# Patient Record
Sex: Female | Born: 1995
Health system: Southern US, Community
[De-identification: ages and names within clinical notes are randomized; demographics above are authoritative.]

---

## 2014-11-04 ENCOUNTER — Ambulatory Visit (INDEPENDENT_AMBULATORY_CARE_PROVIDER_SITE_OTHER): Payer: BLUE CROSS/BLUE SHIELD | Admitting: Physician Assistant

## 2014-11-04 VITALS — BP 96/58 | HR 62 | Temp 99.0°F | Resp 16 | Ht 67.0 in | Wt 123.0 lb

## 2014-11-04 DIAGNOSIS — Z7251 High risk heterosexual behavior: Secondary | ICD-10-CM | POA: Diagnosis not present

## 2014-11-04 DIAGNOSIS — R300 Dysuria: Secondary | ICD-10-CM

## 2014-11-04 LAB — POCT WET PREP WITH KOH
KOH PREP POC: NEGATIVE
TRICHOMONAS UA: NEGATIVE
YEAST WET PREP PER HPF POC: NEGATIVE

## 2014-11-04 LAB — POCT URINALYSIS DIPSTICK
Bilirubin, UA: NEGATIVE
Blood, UA: NEGATIVE
Glucose, UA: NEGATIVE
KETONES UA: NEGATIVE
Leukocytes, UA: NEGATIVE
Nitrite, UA: NEGATIVE
Protein, UA: NEGATIVE
Spec Grav, UA: >=1.03
Urobilinogen, UA: 0.2
pH, UA: 6.5

## 2014-11-04 LAB — POCT UA - MICROSCOPIC ONLY
CRYSTALS, UR, HPF, POC: NEGATIVE
Casts, Ur, LPF, POC: NEGATIVE
Yeast, UA: NEGATIVE

## 2014-11-04 MED ORDER — SULFAMETHOXAZOLE-TRIMETHOPRIM 800-160 MG PO TABS: 1.0000 | ORAL_TABLET | Freq: Two times a day (BID) | ORAL | Status: DC

## 2014-11-04 NOTE — Progress Notes (Deleted)
Subjective:     Patient ID: Paula HerterBrea Fischer, female   DOB: 29-Apr-1995, 19 y.o.   MRN: [id number]  HPI   Review of Systems     Objective:   Physical Exam     Assessment:     ***    Plan:     ***

## 2014-11-04 NOTE — Progress Notes (Signed)
Subjective:    Patient ID: Paula Fischer, female    DOB: [birth year], 19 y.o.   MRN: [id number]  HPI Patient presents for dysuria and urinary frequency that have been present for past week. Additionally endorses lower abdominal pain initially which has improved. Denies urgency, decreased vol., flank/back pain, N/V, vaginal discharge/bleeding/pain. Sexually active with monogamous female partner for 1 year and they use condoms sometimes. Has not been tested for STDs for a long time, but was negative. LMP 10/20/14 was normal. NKDA.   Review of Systems  Constitutional: Negative.  Negative for fever.  Gastrointestinal: Positive for abdominal pain (resolved). Negative for nausea.  Genitourinary: Positive for dysuria. Negative for urgency, hematuria, flank pain, decreased urine volume, vaginal bleeding, vaginal discharge, difficulty urinating, vaginal pain, menstrual problem, pelvic pain and dyspareunia.  Musculoskeletal: Negative for back pain.  Neurological: Negative for headaches.       Objective:   Physical Exam  Constitutional: She is oriented to person, place, and time. She appears well-developed and well-nourished. No distress.  Blood pressure 96/58, pulse 62, temperature 99 F (37.2 C), resp. rate 16, height 5\' 7"  (1.702 m), weight 123 lb (55.792 kg), last menstrual period 10/18/2014, SpO2 98 %.  HENT:  Head: Normocephalic and atraumatic.  Right Ear: External ear normal.  Left Ear: External ear normal.  Eyes: Conjunctivae are normal. Right eye exhibits no discharge. Left eye exhibits no discharge. No scleral icterus.  Cardiovascular: Normal rate, regular rhythm and normal heart sounds.  Exam reveals no gallop and no friction rub.   No murmur heard. Pulmonary/Chest: Effort normal and breath sounds normal. No respiratory distress. She has no wheezes. She has no rales.  Abdominal: Soft. Bowel sounds are normal. She exhibits no distension and no mass. There is no tenderness. There is no  rebound, no guarding and no CVA tenderness. No hernia.  Neurological: She is alert and oriented to person, place, and time.  Skin: Skin is warm and dry. No rash noted. She is not diaphoretic. No erythema. No pallor.  Psychiatric: She has a normal mood and affect. Her behavior is normal. Judgment and thought content normal.   Results for orders placed or performed in visit on 11/04/14  POCT Wet Prep with KOH  Result Value Ref Range   Trichomonas, UA Negative    Clue Cells Wet Prep HPF POC 2-4    Epithelial Wet Prep HPF POC Moderate Few, Moderate, Many   Yeast Wet Prep HPF POC neg    Bacteria Wet Prep HPF POC Moderate (A) Few   RBC Wet Prep HPF POC 0-2    WBC Wet Prep HPF POC 8-12    KOH Prep POC Negative   POCT urinalysis dipstick  Result Value Ref Range   Color, UA yellow    Clarity, UA clear    Glucose, UA neg    Bilirubin, UA neg    Ketones, UA neg    Spec Grav, UA >=1.030    Blood, UA neg    pH, UA 6.5    Protein, UA neg    Urobilinogen, UA 0.2    Nitrite, UA neg    Leukocytes, UA Negative Negative  POCT UA - Microscopic Only  Result Value Ref Range   WBC, Ur, HPF, POC 2-4    RBC, urine, microscopic 1-3    Bacteria, U Microscopic trace    Mucus, UA trace    Epithelial cells, urine per micros 1-3    Crystals, Ur, HPF, POC neg    Casts,  Ur, LPF, POC neg    Yeast, UA neg        Assessment & Plan:  1. Dysuria Labs neg. Will treat presumptively due to symptoms. - POCT urinalysis dipstick - POCT UA - Microscopic Only - sulfamethoxazole-trimethoprim (BACTRIM DS,SEPTRA DS) 800-160 MG per tablet; Take 1 tablet by mouth 2 (two) times daily.  Dispense: 6 tablet; Refill: 0  2. Unprotected sexual intercourse - POCT Wet Prep with KOH - GC/Chlamydia Probe Amp - RPR - HIV antibody   Janan Ridge PA-C  [facility] 11/04/2014 6:27 PM

## 2014-11-05 LAB — HIV ANTIBODY (ROUTINE TESTING W REFLEX): HIV 1&2 Ab, 4th Generation: NONREACTIVE

## 2014-11-05 LAB — RPR

## 2014-11-06 LAB — GC/CHLAMYDIA PROBE AMP
CT PROBE, AMP APTIMA: NEGATIVE
GC Probe RNA: NEGATIVE

## 2014-11-09 ENCOUNTER — Telehealth: Payer: Self-pay | Admitting: Physician Assistant

## 2014-11-09 NOTE — Telephone Encounter (Signed)
Left vmail. STD screening labs all neg.

## 2014-11-10 NOTE — Addendum Note (Signed)
Addended by: Carmelina DaneANDERSON, Suki Crockett S on: 11/10/2014 05:35 PM   Modules accepted: Kipp BroodSmartSet

## 2014-11-10 NOTE — Progress Notes (Signed)
  Medical screening examination/treatment/procedure(s) were performed by non-physician practitioner and as supervising physician I was immediately available for consultation/collaboration.     

## 2015-02-08 ENCOUNTER — Ambulatory Visit (INDEPENDENT_AMBULATORY_CARE_PROVIDER_SITE_OTHER): Payer: BLUE CROSS/BLUE SHIELD | Admitting: Family Medicine

## 2015-02-08 VITALS — BP 116/78 | HR 100 | Temp 98.5°F | Resp 16 | Ht 67.0 in | Wt 121.0 lb

## 2015-02-08 DIAGNOSIS — Z113 Encounter for screening for infections with a predominantly sexual mode of transmission: Secondary | ICD-10-CM | POA: Diagnosis not present

## 2015-02-08 LAB — POCT WET + KOH PREP
Trich by wet prep: ABSENT
YEAST BY WET PREP: ABSENT
Yeast by KOH: ABSENT

## 2015-02-08 NOTE — Patient Instructions (Signed)
I will be in touch with your labs asap If you do test positive for herpes, we can start you on suppressive therapy (medication to help prevent outbreaks) if you like If you are negative I would suggest that we repeat your blood test in 4-6 months to make sure it stays negative

## 2015-02-08 NOTE — Progress Notes (Signed)
[facility] [address], [place] [address] [phone]  Date:  02/08/2015   Name:  Paula HerterBrea Delarosa   DOB:  Feb 23, 1996   MRN:  [id number]  PCP:  No PCP Per Patient    Chief Complaint: Exposure to STD   History of Present Illness:  Paula HerterBrea Ekman is a 19 y.o. very pleasant female patient who presents with the following:  Generally healthy young lady here today with concern of STI She learned that her BF had been unfaithful to her yesterday- also his previous GF told her that she caught herpes from this person.   A couple of weeks ago she thought she had a "razor bump" lesion in her groin- this was pretty painful.  It is now healed up but she is worried that this was actually HSV  No unusual discharge.   LMP was 9/25   There are no active problems to display for this patient.   No past medical history on file.  No past surgical history on file.  Social History  Substance Use Topics  . Smoking status: Never Smoker   . Smokeless tobacco: Never Used  . Alcohol Use: No    No family history on file.  No Known Allergies  Medication list has been reviewed and updated.  No current outpatient prescriptions on file prior to visit.   No current facility-administered medications on file prior to visit.    Review of Systems:  As per HPI- otherwise negative.   Physical Examination: Filed Vitals:   02/08/15 1233  BP: 116/78  Pulse: 100  Temp: 98.5 F (36.9 C)  Resp: 16   Filed Vitals:   02/08/15 1233  Height: 5\' 7"  (1.702 m)  Weight: 121 lb (54.885 kg)   Body mass index is 18.95 kg/(m^2). Ideal Body Weight: Weight in (lb) to have BMI = 25: 159.3  GEN: WDWN, NAD, Non-toxic, A & O x 3, looks well HEENT: Atraumatic, Normocephalic. Neck supple. No masses, No LAD. Ears and Nose: No external deformity. CV: RRR, No M/G/R. No JVD. No thrill. No extra heart sounds. PULM: CTA B, no wheezes, crackles, rhonchi. No retractions. No resp. distress. No  accessory muscle use. ABD: S, NT, ND EXTR: No c/c/e NEURO Normal gait.  PSYCH: Normally interactive. Conversant. Not depressed or anxious appearing.  Calm demeanor.  Pelvic: normal, no vaginal lesions or discharge.  Results for orders placed or performed in visit on 02/08/15  POCT Wet + KOH Prep ([facility])  Result Value Ref Range   Yeast by KOH Absent Present, Absent   Yeast by wet prep Absent Present, Absent   WBC by wet prep Moderate (A) None, Few   Clue Cells Wet Prep HPF POC Few (A) None   Trich by wet prep Absent Present, Absent   Bacteria Wet Prep HPF POC Few None, Few   Epithelial Cells By [employer] Pref ([facility]) Moderate (A) None, Few   RBC,UR,HPF,POC None None RBC/hpf    Assessment and Plan: Screening for STD (sexually transmitted disease) - Plan: GC/Chlamydia Probe Amp, Hepatitis B surface antibody, Hepatitis B surface antigen, Hepatitis C antibody, HIV antibody, HSV(herpes simplex vrs) 1+2 ab-IgG, RPR, POCT Wet + KOH Prep ([facility])  Concern about possible HSV exposure.  She does not currently have any sign of outbreak.  Did labs as above Reassured that we will be in touch with her labs asap and discussed what we might do if she is positive for HSV  Signed Abbe AmsterdamJessica Ivon Oelkers, MD

## 2015-02-09 ENCOUNTER — Encounter: Payer: Self-pay | Admitting: Family Medicine

## 2015-02-09 DIAGNOSIS — R768 Other specified abnormal immunological findings in serum: Secondary | ICD-10-CM

## 2015-02-09 LAB — HSV(HERPES SIMPLEX VRS) I + II AB-IGG
HSV 1 Glycoprotein G Ab, IgG: 7.53 IV — ABNORMAL HIGH
HSV 2 GLYCOPROTEIN G AB, IGG: 9.77 IV — AB

## 2015-02-09 LAB — HEPATITIS B SURFACE ANTIGEN: Hepatitis B Surface Ag: NEGATIVE

## 2015-02-09 LAB — HEPATITIS B SURFACE ANTIBODY, QUANTITATIVE: HEPATITIS B-POST: 0.1 m[IU]/mL

## 2015-02-09 LAB — HIV ANTIBODY (ROUTINE TESTING W REFLEX): HIV: NONREACTIVE

## 2015-02-09 LAB — RPR

## 2015-02-09 LAB — GC/CHLAMYDIA PROBE AMP
CT Probe RNA: NEGATIVE
GC Probe RNA: NEGATIVE

## 2015-02-09 LAB — HEPATITIS C ANTIBODY: HCV AB: NEGATIVE

## 2015-02-10 MED ORDER — VALACYCLOVIR HCL 1 G PO TABS: 1000.0000 mg | ORAL_TABLET | Freq: Every day | ORAL | Status: DC

## 2015-02-11 ENCOUNTER — Encounter: Payer: Self-pay | Admitting: Family Medicine

## 2015-03-10 ENCOUNTER — Encounter: Payer: Self-pay | Admitting: Family Medicine

## 2015-03-10 DIAGNOSIS — R768 Other specified abnormal immunological findings in serum: Secondary | ICD-10-CM

## 2015-03-11 MED ORDER — VALACYCLOVIR HCL 1 G PO TABS: 1000.0000 mg | ORAL_TABLET | Freq: Every day | ORAL | Status: DC

## 2015-03-12 ENCOUNTER — Other Ambulatory Visit: Payer: Self-pay

## 2015-03-12 DIAGNOSIS — R768 Other specified abnormal immunological findings in serum: Secondary | ICD-10-CM

## 2015-05-07 ENCOUNTER — Telehealth: Payer: Self-pay

## 2015-05-07 NOTE — Telephone Encounter (Signed)
Spoke with patient. She needs labs sent [facility]. Verbal request form completed and labs faxed thru Epic.

## 2015-05-07 NOTE — Telephone Encounter (Signed)
Patient left voicemail today at [phone] [place] and needs labs from last visit in October sent to her doctor for annual exam. Says she will go online and complete ROI form.  Cb# [phone].

## 2015-08-26 ENCOUNTER — Ambulatory Visit (INDEPENDENT_AMBULATORY_CARE_PROVIDER_SITE_OTHER): Payer: BLUE CROSS/BLUE SHIELD | Admitting: Family Medicine

## 2015-08-26 VITALS — BP 102/64 | HR 66 | Temp 99.0°F | Resp 16 | Ht 67.0 in | Wt 131.0 lb

## 2015-08-26 DIAGNOSIS — Z0000 Encounter for general adult medical examination without abnormal findings: Secondary | ICD-10-CM

## 2015-08-26 DIAGNOSIS — Z111 Encounter for screening for respiratory tuberculosis: Secondary | ICD-10-CM

## 2015-08-26 NOTE — Progress Notes (Signed)
  Tuberculosis Risk Questionnaire  1. No Were you born outside the [place] in one of the following parts of the world: [place], [place], [place], [place] or [place]?    2. No Have you traveled outside the [place] and lived for more than one month in one of the following parts of the world: [place], [place], [place], [place] or [place]?    3. No Do you have a compromised immune system such as from any of the following conditions:HIV/AIDS, organ or bone marrow transplantation, diabetes, immunosuppressive medicines (e.g. Prednisone, Remicaide), leukemia, lymphoma, cancer of the head or neck, gastrectomy or jejunal bypass, end-stage renal disease (on dialysis), or silicosis?     4. Yes Have you ever or do you plan on working in: a residential care center, a health care facility, a jail or prison or homeless shelter?    5. No Have you ever: injected illegal drugs, used crack cocaine, lived in a homeless shelter  or been in jail or prison?     6. No Have you ever been exposed to anyone with infectious tuberculosis?    Tuberculosis Symptom Questionnaire  Do you currently have any of the following symptoms?  1. No Unexplained cough lasting more than 3 weeks?   2. No Unexplained fever lasting more than 3 weeks.   3. No Night Sweats (sweating that leaves the bedclothes and sheets wet)     4. No Shortness of Breath   5. No Chest Pain   6. No Unintentional weight loss    7. 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      ICD-9-CM ICD-10-CM   1. Routine general medical examination at a health care facility V70.0 Z00.00 Quantiferon tb gold assay     Hepatitis B surface antibody     Signed, Elvina SidleKurt Lauenstein, MD

## 2015-08-26 NOTE — Patient Instructions (Addendum)
Check MYCHART for lab result

## 2015-08-27 LAB — HEPATITIS B SURFACE ANTIBODY, QUANTITATIVE: Hepatitis B-Post: 0.1 m[IU]/mL

## 2015-08-28 LAB — QUANTIFERON TB GOLD ASSAY (BLOOD)
Interferon Gamma Release Assay: NEGATIVE
Mitogen-Nil: >10 IU/mL
Quantiferon Nil Value: 0.02 IU/mL
Quantiferon Tb Ag Minus Nil Value: 0.01 IU/mL

## 2016-02-07 ENCOUNTER — Ambulatory Visit (INDEPENDENT_AMBULATORY_CARE_PROVIDER_SITE_OTHER): Payer: BLUE CROSS/BLUE SHIELD | Admitting: Family Medicine

## 2016-02-07 VITALS — BP 118/80 | HR 73 | Temp 98.2°F | Resp 18 | Ht 67.0 in | Wt 132.4 lb

## 2016-02-07 DIAGNOSIS — R21 Rash and other nonspecific skin eruption: Secondary | ICD-10-CM

## 2016-02-07 MED ORDER — CLOBETASOL PROPIONATE 0.05 % EX OINT: 1.0000 "application " | TOPICAL_OINTMENT | Freq: Two times a day (BID) | CUTANEOUS | 0 refills | Status: DC

## 2016-02-07 NOTE — Patient Instructions (Addendum)
Apply Lotrisone ointment to affected areas.  Follow-up as needed.   IF you received an x-ray today, you will receive an invoice from [facility]. Please contact [facility] at [phone] with questions or concerns regarding your invoice.   IF you received labwork today, you will receive an invoice from [employer]. Please contact [facility] at [phone] with questions or concerns regarding your invoice.   Our billing staff will not be able to assist you with questions regarding bills from these companies.  You will be contacted with the lab results as soon as they are available. The fastest way to get your results is to activate your My Chart account. Instructions are located on the last page of this paperwork. If you have not heard from [place] regarding the results in 2 weeks, please contact this office.     Bee, Wasp, or [employer], wasps, and hornets are part of a family of insects that can sting people. These stings can cause pain and inflammation, but they are usually not serious. However, some people may have an allergic reaction to a sting. This can cause the symptoms to be more severe.  SYMPTOMS  Common symptoms of this condition include:   A red lump in the skin that sometimes has a tiny hole in the center. In some cases, a stinger may be in the center of the wound.  Pain and itching at the sting site.  Redness and swelling around the sting site. If you have an allergic reaction (localized allergic reaction), the swelling and redness may spread out from the sting site. In some cases, this reaction can continue to develop over the next 12-36 hours. In rare cases, a person may have a severe allergic reaction (anaphylactic reaction) to a sting. Symptoms of an anaphylactic reaction may include:   Wheezing or difficulty breathing.  Raised, itchy, red patches on the skin.  Nausea or vomiting.  Abdominal  cramping.  Diarrhea.  Chest pain.  Fainting.  Redness of the face (flushing). DIAGNOSIS  This condition is usually diagnosed based on symptoms, medical history, and a physical exam. TREATMENT  Most stings can be treated with:   Icing to reduce swelling.  Medicines (antihistamines) to treat itching or an allergic reaction.  Medicines to help reduce pain. These may be medicines that you take by mouth, or medicated creams or lotions that you apply to your skin. If you were stung by a bee, the stinger and a small sac of poison may be in the wound. This may be removed by brushing across it with a flat card, such as a credit card. Another method is to pinch the area and pull it out. These methods can help reduce the severity of the body's reaction to the sting.  HOME CARE INSTRUCTIONS   Wash the sting site daily with soap and water as told by your health care provider.  Apply or take over-the-counter and prescription medicines only as told by your health care provider.  If directed, apply ice to the sting area.  Put ice in a plastic bag.  Place a towel between your skin and the bag.  Leave the ice on for 20 minutes, 2-3 times per day.  Do not scratch the sting area.  To lessen pain, try using a paste that is made of water and baking soda. Rub the paste on the sting area and leave it on for 5 minutes.  If you had a severe allergic reaction to a sting, you  may need:  To wear a medical bracelet or necklace that lists the allergy.  To learn when and how to use an anaphylaxis kit or epinephrine injection. Your family members may also need to learn this.  To carry an anaphylaxis kit with you at all times. SEEK MEDICAL CARE IF:   Your symptoms do not get better in 2-3 days.  You have redness, swelling, or pain that spreads beyond the area of the sting.  You have a fever. SEEK IMMEDIATE MEDICAL CARE IF:  You have symptoms of a severe allergic reaction. These include:    Wheezing or difficulty breathing.  Chest pain.  Light-headedness or fainting.  Itchy, raised, red patches on the skin.  Nausea or vomiting.  Abdominal cramping.  Diarrhea.   This information is not intended to replace advice given to you by your health care provider. Make sure you discuss any questions you have with your health care provider.   Document Released: 04/10/2005 Document Revised: 12/30/2014 Document Reviewed: 08/26/2014 Elsevier Interactive Patient Education [employer].

## 2016-02-07 NOTE — Progress Notes (Signed)
   Patient ID: Paula Fischer, female    DOB: 1995-08-10, 20 y.o.   MRN: [id number]  PCP: No PCP Per Patient  Chief Complaint  Patient presents with  . Rash    located on right arm    Subjective:   HPI 20 year old, female, presents for evaluation of right arm rash time two days. This is the second eruption of rash over the last month. She reports that her apartment building has an infestation of bed bugs. Her apartment was treated and prior rash resolved. She awaken two days ago with an eruption of red, itchy, swollen, lesions on various spots of her right arm. Denies fever and hasn't used any medication.  Social History   Social History  . Marital status: Single    Spouse name: N/A  . Number of children: N/A  . Years of education: N/A   Occupational History  . Not on file.   Social History Main Topics  . Smoking status: Never Smoker  . Smokeless tobacco: Never Used  . Alcohol use No  . Drug use: No  . Sexual activity: Yes    Birth control/ protection: None, Condom   Other Topics Concern  . Not on file   Social History Narrative  . No narrative on file    No family history on file. Review of Systems See HPI  There are no active problems to display for this patient.    Prior to Admission medications   Medication Sig Start Date End Date Taking? Authorizing Provider  valACYclovir (VALTREX) 1000 MG tablet Take 1 tablet (1,000 mg total) by mouth daily. Use for suppression of outbreaks Patient not taking: Reported on 02/07/2016 03/11/15   Pearline CablesJessica C Copland, MD   No Known Allergies     Objective:  Physical Exam  Constitutional: She appears well-developed and well-nourished.  HENT:  Head: Normocephalic and atraumatic.  Right Ear: External ear normal.  Left Ear: External ear normal.  Nose: Nose normal.  Eyes: Conjunctivae and EOM are normal. Pupils are equal, round, and reactive to light.  Neck: Normal range of motion. Neck supple.  Cardiovascular: Normal rate,  regular rhythm, normal heart sounds and intact distal pulses.   Pulmonary/Chest: Effort normal and breath sounds normal.  Skin: Skin is warm and dry. Rash noted. Rash is urticarial.  Erythematous urticarial lesions diffusely arranged on right arm.  Psychiatric: She has a normal mood and affect. Her behavior is normal. Judgment and thought content normal.   Vitals:   02/07/16 1144  BP: 118/80  Pulse: 73  Resp: 18  Temp: 98.2 F (36.8 C)      Assessment & Plan:  1. Insect bite, initial encounter, possible bed bugs. Patient reported a recent infestation in apartment. Healed lesions on bilateral arms. New eruptions localized to right arm only.  Plan: Clobetasol ointment, 1 application 2 times daily until lesions clear or 2 weeks. Return for care as needed.  Godfrey PickKimberly S. Tiburcio PeaHarris, MSN, FNP-C [facility]

## 2016-06-15 ENCOUNTER — Ambulatory Visit (INDEPENDENT_AMBULATORY_CARE_PROVIDER_SITE_OTHER): Payer: BLUE CROSS/BLUE SHIELD | Admitting: Family Medicine

## 2016-06-15 VITALS — BP 112/66 | HR 66 | Temp 98.8°F | Resp 16 | Ht 67.0 in | Wt 131.0 lb

## 2016-06-15 DIAGNOSIS — R768 Other specified abnormal immunological findings in serum: Secondary | ICD-10-CM | POA: Diagnosis not present

## 2016-06-15 DIAGNOSIS — Z01419 Encounter for gynecological examination (general) (routine) without abnormal findings: Secondary | ICD-10-CM | POA: Diagnosis not present

## 2016-06-15 LAB — POC MICROSCOPIC URINALYSIS (UMFC): MUCUS RE: ABSENT

## 2016-06-15 LAB — POCT WET + KOH PREP
TRICH BY WET PREP: ABSENT
Yeast by KOH: ABSENT

## 2016-06-15 LAB — POCT URINALYSIS DIP (MANUAL ENTRY)
Bilirubin, UA: NEGATIVE
Glucose, UA: NEGATIVE
Ketones, POC UA: NEGATIVE
NITRITE UA: NEGATIVE
PH UA: 5.5
PROTEIN UA: NEGATIVE
Spec Grav, UA: >=1.03
Urobilinogen, UA: 0.2

## 2016-06-15 MED ORDER — VALACYCLOVIR HCL 1 G PO TABS: 1000.0000 mg | ORAL_TABLET | Freq: Every day | ORAL | 3 refills | Status: DC

## 2016-06-15 MED ORDER — FLUCONAZOLE 150 MG PO TABS: 150.0000 mg | ORAL_TABLET | Freq: Once | ORAL | 0 refills | Status: AC

## 2016-06-15 NOTE — Progress Notes (Signed)
   Patient ID: Paula Fischer, female    DOB: Aug 27, 1995, 21 y.o.   MRN: [id number]  PCP: No PCP Per Patient  Chief Complaint  Patient presents with  . STI Testing  . Pap smear    Subjective:  HPI  21 year old female present for evaluation of STI and her initial pap smear. Hx of HSV-2. Reports that she is sexual active uses condoms with most encounters. She is not on oral or any other contraceptives. Today she is not interested in discussing contraceptive options. Denies irregular or heaving menses.Patient's last menstrual period was 05/30/2016.   Social History   Social History  . Marital status: Single    Spouse name: N/A  . Number of children: N/A  . Years of education: N/A   Occupational History  . Not on file.   Social History Main Topics  . Smoking status: Never Smoker  . Smokeless tobacco: Never Used  . Alcohol use No  . Drug use: No  . Sexual activity: Yes    Birth control/ protection: None, Condom   Other Topics Concern  . Not on file   Social History Narrative  . No narrative on file    No family history on file.   Review of Systems  See HPI  Prior to Admission medications   Medication Sig Start Date End Date Taking? Authorizing Provider  valACYclovir (VALTREX) 1000 MG tablet Take 1 tablet (1,000 mg total) by mouth daily. Use for suppression of outbreaks 03/11/15  Yes Pearline CablesJessica C Copland, MD  Past Medical, Surgical Family and Social History reviewed and updated.  Objective:   Today's Vitals   06/15/16 1554  BP: 112/66  Pulse: 66  Resp: 16  Temp: 98.8 F (37.1 C)  TempSrc: Oral  SpO2: 100%  Weight: 131 lb (59.4 kg)  Height: 5\' 7"  (1.702 m)    Wt Readings from Last 3 Encounters:  06/15/16 131 lb (59.4 kg)  02/07/16 132 lb 6.4 oz (60.1 kg)  08/26/15 131 lb (59.4 kg)   Physical Exam  Constitutional: She is oriented to person, place, and time. She appears well-developed and well-nourished.  HENT:  Head: Normocephalic and atraumatic.  Right  Ear: External ear normal.  Left Ear: External ear normal.  Nose: Nose normal.  Mouth/Throat: Oropharynx is clear and moist.  Eyes: Conjunctivae are normal. Pupils are equal, round, and reactive to light.  Neck: Normal range of motion. Neck supple.  Cardiovascular: Normal rate, regular rhythm, normal heart sounds and intact distal pulses.   Pulmonary/Chest: Effort normal and breath sounds normal.  Neurological: She is alert and oriented to person, place, and time.  Skin: Skin is warm and dry.  Psychiatric: She has a normal mood and affect. Her behavior is normal. Judgment and thought content normal.      Assessment & Plan:  1. Encounter for annual routine gynecological examination 2. HSV-2 seropositive 3. Yeast Infection  Wet prep was positive for yeast  -Start Diflucan 150 mg once and repeat in 7 days if discharge doesn't resolve. Refilled Valtrex patient recently had a HSV-2 flare- -Refilled Valcyoclovir (VALTREX) 1000 MG tablet; Take 1 tablet (1,000 mg total) by mouth daily. Use for suppression of outbreaks.  You will be notified of her labs.  Godfrey PickKimberly S. Tiburcio PeaHarris, MSN, FNP-C [facility] [phone]

## 2016-06-15 NOTE — Patient Instructions (Addendum)
I have refilled your Valacyclovir prescription and provided you with refills.  Take Diflucan 150 mg once. May repeat in 7 days if discharge doesn't resolve.   I will notify you of your lab results.    IF you received an x-ray today, you will receive an invoice from [facility]. Please contact [facility] at [phone] with questions or concerns regarding your invoice.   IF you received labwork today, you will receive an invoice from [place]. Please contact [facility] at [phone] with questions or concerns regarding your invoice.   Our billing staff will not be able to assist you with questions regarding bills from these companies.  You will be contacted with the lab results as soon as they are available. The fastest way to get your results is to activate your My Chart account. Instructions are located on the last page of this paperwork. If you have not heard from [place] regarding the results in 2 weeks, please contact this office.     Sexually Transmitted Disease A sexually transmitted disease (STD) is a disease or infection often passed to another person during sex. However, STDs can be passed through nonsexual ways. An STD can be passed through:  Spit (saliva).  Semen.  Blood.  Mucus from the vagina.  Pee (urine). How can I lessen my chances of getting an STD?  Use:  Latex condoms.  Water-soluble lubricants with condoms. Do not use petroleum jelly or oils.  Dental dams. These are small pieces of latex that are used as a barrier during oral sex.  Avoid having more than one sex partner.  Do not have sex with someone who has other sex partners.  Do not have sex with anyone you do not know or who is at high risk for an STD.  Avoid risky sex that can break your skin.  Do not have sex if you have open sores on your mouth or skin.  Avoid drinking too much alcohol or taking illegal drugs. Alcohol and drugs can affect your good judgment.  Avoid oral and  anal sex acts.  Get shots (vaccines) for HPV and hepatitis.  If you are at risk of being infected with HIV, it is advised that you take a certain medicine daily to prevent HIV infection. This is called pre-exposure prophylaxis (PrEP). You may be at risk if:  You are a man who has sex with other men (MSM).  You are attracted to the opposite sex (heterosexual) and are having sex with more than one partner.  You take drugs with a needle.  You have sex with someone who has HIV.  Talk with your doctor about if you are at high risk of being infected with HIV. If you begin to take PrEP, get tested for HIV first. Get tested every 3 months for as long as you are taking PrEP.  Get tested for STDs every year if you are sexually active. If you are treated for an STD, get tested again 3 months after you are treated. What should I do if I think I have an STD?  See your doctor.  Tell your sex partner(s) that you have an STD. They should be tested and treated.  Do not have sex until your doctor says it is okay. When should I get help? Get help right away if:  You have bad belly (abdominal) pain.  You are a man and have puffiness (swelling) or pain in your testicles.  You are a woman and have puffiness in your vagina.  This information is not intended to replace advice given to you by your health care provider. Make sure you discuss any questions you have with your health care provider. Document Released: 05/18/2004 Document Revised: 09/16/2015 Document Reviewed: 10/04/2012 Elsevier Interactive Patient Education  2017 [employer]

## 2016-06-16 LAB — HIV ANTIBODY (ROUTINE TESTING W REFLEX): HIV SCREEN 4TH GENERATION: NONREACTIVE

## 2016-06-16 LAB — RPR: RPR Ser Ql: NONREACTIVE

## 2016-06-17 LAB — URINE CULTURE

## 2016-06-19 ENCOUNTER — Encounter: Payer: Self-pay | Admitting: Family Medicine

## 2016-06-19 LAB — PAP IG AND CT-NG NAA
CHLAMYDIA, NUC. ACID AMP: NEGATIVE
Gonococcus by Nucleic Acid Amp: NEGATIVE
PAP SMEAR COMMENT: 0

## 2016-07-04 ENCOUNTER — Encounter: Payer: Self-pay | Admitting: Family Medicine

## 2016-12-13 ENCOUNTER — Ambulatory Visit (INDEPENDENT_AMBULATORY_CARE_PROVIDER_SITE_OTHER): Payer: BLUE CROSS/BLUE SHIELD | Admitting: Physician Assistant

## 2016-12-13 ENCOUNTER — Encounter: Payer: Self-pay | Admitting: Physician Assistant

## 2016-12-13 VITALS — BP 109/71 | HR 69 | Temp 98.7°F | Resp 16 | Ht 67.0 in | Wt 137.8 lb

## 2016-12-13 DIAGNOSIS — N898 Other specified noninflammatory disorders of vagina: Secondary | ICD-10-CM

## 2016-12-13 DIAGNOSIS — N760 Acute vaginitis: Secondary | ICD-10-CM

## 2016-12-13 DIAGNOSIS — B9689 Other specified bacterial agents as the cause of diseases classified elsewhere: Secondary | ICD-10-CM | POA: Diagnosis not present

## 2016-12-13 LAB — POCT URINALYSIS DIP (MANUAL ENTRY)
Bilirubin, UA: NEGATIVE
Blood, UA: NEGATIVE
Glucose, UA: NEGATIVE mg/dL
Ketones, POC UA: NEGATIVE mg/dL
Nitrite, UA: NEGATIVE
Protein Ur, POC: NEGATIVE mg/dL
Spec Grav, UA: 1.025 (ref 1.010–1.025)
Urobilinogen, UA: 0.2 E.U./dL
pH, UA: 5.5 (ref 5.0–8.0)

## 2016-12-13 NOTE — Patient Instructions (Addendum)
You will receive a phone call with your lab results and a plan for treatment.  Try to change up your diet - with one change at a time. Some ladies are affected by the amount of wheat they eat/drink and/or sugar. See if cutting back/eliminating something helps.  Stay well hydrated. Drink 2-3 liters of water daily.   Thank you for coming in today. I hope you feel we met your needs.  Feel free to call PCP if you have any questions or further requests.  Please consider signing up for MyChart if you do not already have it, as this is a great way to communicate with me.  Best,  Whitney McVey, PA-C  IF you received an x-ray today, you will receive an invoice from [facility]. Please contact [facility]iology at [phone] with questions or concerns regarding your invoice.   IF you received labwork today, you will receive an invoice from [place]. Please contact [facility] at [phone] with questions or concerns regarding your invoice.   Our billing staff will not be able to assist you with questions regarding bills from these companies.  You will be contacted with the lab results as soon as they are available. The fastest way to get your results is to activate your My Chart account. Instructions are located on the last page of this paperwork. If you have not heard from [place] regarding the results in 2 weeks, please contact this office.

## 2016-12-13 NOTE — Progress Notes (Signed)
Paula Fischer  MRN: [id number] DOB: Mar 04, 1996  PCP: Patient, No Pcp Per  Subjective:  Pt is a pleasant 21 year old female who presents to clinic for vaginal discharge x 1 year.  Discharge is every day described as white and milky, odiferous. She complains that her underwear is "always kind of moist and I don't like that feeling".  Denies vaginal pain, itching, abnormal bleeding, abdominal pain, back pain, flank pain, urinary symptoms.  Last PAP 05/2016 - negative.  Recent break-up of long-term sexual partner. He never mentioned odor or discharge.  H/o HSV-2.  Recent life stressors: graduated from college and started a new job.   Review of Systems  Constitutional: Negative for chills, fatigue and fever.  Gastrointestinal: Negative for abdominal pain, diarrhea, nausea and vomiting.  Genitourinary: Positive for vaginal discharge. Negative for decreased urine volume, difficulty urinating, dysuria, enuresis, flank pain, frequency, hematuria, menstrual problem, pelvic pain, urgency, vaginal bleeding and vaginal pain.  Musculoskeletal: Negative for back pain.  Neurological: Negative for dizziness, weakness, light-headedness and headaches.    There are no active problems to display for this patient.   Current Outpatient Prescriptions on File Prior to Visit  Medication Sig Dispense Refill  . valACYclovir (VALTREX) 1000 MG tablet Take 1 tablet (1,000 mg total) by mouth daily. Use for suppression of outbreaks 90 tablet 3   No current facility-administered medications on file prior to visit.     No Known Allergies   Objective:  BP 109/71   Pulse 69   Temp 98.7 F (37.1 C) (Oral)   Resp 16   Ht 5\' 7"  (1.702 m)   Wt 137 lb 12.8 oz (62.5 kg)   LMP 11/23/2016   SpO2 98%   BMI 21.58 kg/m   Physical Exam  Constitutional: She is oriented to person, place, and time and well-developed, well-nourished, and in no distress. No distress.  Cardiovascular: Normal rate, regular rhythm and  normal heart sounds.   Genitourinary: Right adnexa normal, left adnexa normal and vulva normal.  Cervix is not fixed. Thin  white and vaginal discharge found.  Neurological: She is alert and oriented to person, place, and time. GCS score is 15.  Skin: Skin is warm and dry.  Psychiatric: Mood, memory, affect and judgment normal.  Vitals reviewed.  Results for orders placed or performed in visit on 12/13/16  POCT urinalysis dipstick  Result Value Ref Range   Color, UA yellow yellow   Clarity, UA cloudy (A) clear   Glucose, UA negative negative mg/dL   Bilirubin, UA negative negative   Ketones, POC UA negative negative mg/dL   Spec Grav, UA 6.314 9.702 - 1.025   Blood, UA negative negative   pH, UA 5.5 5.0 - 8.0   Protein Ur, POC negative negative mg/dL   Urobilinogen, UA 0.2 0.2 or 1.0 E.U./dL   Nitrite, UA Negative Negative   Leukocytes, UA Small (1+) (A) Negative    OV 05/2016:  PAP: NEGATIVE FOR INTRAEPITHELIAL LESION AND MALIGNANCY Chlamydia: negative Gonorrhea: negative Assessment and Plan :  1. Vaginal discharge 2. Vaginal odor - GC/Chlamydia Probe Amp - POCT urinalysis dipstick - Hemoglobin A1c - WET PREP FOR TRICH, YEAST, CLUE - Pt c/o vaginal odor and discharge x 1 year. She has tested negative for GC/Chlamydia and had a negative PAP 6 months ago. Small leukocytes in her urine and she denies urinary symptoms, will not treat for UTI. Suspect possible BV vs yeast vs normal vaginal pH changes vs DM. Labs are pending, will  contact with results and plan.   Marco Collie, PA-C  Primary Care at [facility] 12/13/2016 8:23 AM

## 2016-12-14 LAB — HEMOGLOBIN A1C
Est. average glucose Bld gHb Est-mCnc: 100 mg/dL
Hgb A1c MFr Bld: 5.1 % (ref 4.8–5.6)

## 2016-12-14 LAB — WET PREP FOR TRICH, YEAST, CLUE
Clue Cell Exam: POSITIVE — AB
Trichomonas Exam: NEGATIVE
Yeast Exam: POSITIVE — AB

## 2016-12-14 MED ORDER — METRONIDAZOLE 500 MG PO TABS: 500.0000 mg | ORAL_TABLET | Freq: Two times a day (BID) | ORAL | 0 refills | Status: DC

## 2016-12-14 MED ORDER — FLUCONAZOLE 150 MG PO TABS: 150.0000 mg | ORAL_TABLET | Freq: Once | ORAL | 0 refills | Status: AC

## 2016-12-14 NOTE — Progress Notes (Signed)
Please call pt and let her know her labs are positive for bacterial vaginosis and yeast infection. She can pick up medications at her pharmacy. Take the Flagyl first.

## 2016-12-14 NOTE — Addendum Note (Signed)
Addended by: Sebastian Ache on: 12/14/2016 01:20 PM   Modules accepted: Orders

## 2016-12-16 LAB — GC/CHLAMYDIA PROBE AMP
Chlamydia trachomatis, NAA: NEGATIVE
Neisseria gonorrhoeae by PCR: NEGATIVE

## 2016-12-24 ENCOUNTER — Emergency Department (HOSPITAL_COMMUNITY): Payer: BLUE CROSS/BLUE SHIELD

## 2016-12-24 ENCOUNTER — Other Ambulatory Visit: Payer: Self-pay

## 2016-12-24 ENCOUNTER — Emergency Department (HOSPITAL_COMMUNITY)
Admission: EM | Admit: 2016-12-24 | Discharge: 2016-12-24 | Disposition: A | Discharge status: Home / Self Care | Payer: BLUE CROSS/BLUE SHIELD | Attending: Emergency Medicine | Admitting: Emergency Medicine

## 2016-12-24 ENCOUNTER — Encounter (HOSPITAL_COMMUNITY): Payer: Self-pay | Admitting: Emergency Medicine

## 2016-12-24 DIAGNOSIS — Z79899 Other long term (current) drug therapy: Secondary | ICD-10-CM | POA: Insufficient documentation

## 2016-12-24 DIAGNOSIS — R079 Chest pain, unspecified: Secondary | ICD-10-CM | POA: Diagnosis not present

## 2016-12-24 DIAGNOSIS — R0602 Shortness of breath: Secondary | ICD-10-CM | POA: Diagnosis not present

## 2016-12-24 DIAGNOSIS — R0600 Dyspnea, unspecified: Secondary | ICD-10-CM | POA: Diagnosis not present

## 2016-12-24 DIAGNOSIS — R0789 Other chest pain: Secondary | ICD-10-CM | POA: Diagnosis not present

## 2016-12-24 LAB — CBC
HEMATOCRIT: 35.4 % — AB (ref 36.0–46.0)
HEMOGLOBIN: 12.1 g/dL (ref 12.0–15.0)
MCH: 29.6 pg (ref 26.0–34.0)
MCHC: 34.2 g/dL (ref 30.0–36.0)
MCV: 86.6 fL (ref 78.0–100.0)
PLATELETS: 314 10*3/uL (ref 150–400)
RBC: 4.09 MIL/uL (ref 3.87–5.11)
RDW: 13.2 % (ref 11.5–15.5)
WBC: 5.7 10*3/uL (ref 4.0–10.5)

## 2016-12-24 LAB — BASIC METABOLIC PANEL
ANION GAP: 11 (ref 5–15)
BUN: 11 mg/dL (ref 6–20)
CALCIUM: 9.3 mg/dL (ref 8.9–10.3)
CHLORIDE: 106 mmol/L (ref 101–111)
CO2: 21 mmol/L — AB (ref 22–32)
Creatinine, Ser: 0.68 mg/dL (ref 0.44–1.00)
GFR calc non Af Amer: >60 mL/min (ref >60)
GLUCOSE: 88 mg/dL (ref 65–99)
POTASSIUM: 3.8 mmol/L (ref 3.5–5.1)
Sodium: 138 mmol/L (ref 135–145)

## 2016-12-24 LAB — I-STAT BETA HCG BLOOD, ED (MC, WL, AP ONLY): I-stat hCG, quantitative: <5 m[IU]/mL (ref <5)

## 2016-12-24 LAB — I-STAT TROPONIN, ED: TROPONIN I, POC: 0 ng/mL (ref 0.00–0.08)

## 2016-12-24 LAB — D-DIMER, QUANTITATIVE: D-Dimer, Quant: 0.8 ug/mL-FEU — ABNORMAL HIGH (ref 0.00–0.50)

## 2016-12-24 MED ORDER — IOPAMIDOL (ISOVUE-370) INJECTION 76%
INTRAVENOUS | Status: AC
  Administered 2016-12-24: 100 mL
  Filled 2016-12-24: qty 100

## 2016-12-24 NOTE — ED Provider Notes (Signed)
[facility]-EMERGENCY DEPT Provider Note   CSN: [id number] Arrival date & time: 12/24/16  0047     History   Chief Complaint Chief Complaint  Patient presents with  . Chest Pain    HPI Paula Fischer is a 21 y.o. female.  HPI   21 year old female presenting for evaluation of chest discomfort. Patient states for the past 2 weeks she has had intermittent pressure sensation in her mid chest, and having trouble taking a deep breath. chest discomfort happen with exertion and some time with rest. It was more intense earlier today but it has since improved.she denies any specific treatment tried. [place] did travel to [place] (5 [place] drive) 3 weeks ago. She denies any prior history of PE or DVT, no recent surgery, prolonged bed rest, unilateral leg swelling or calf pain, active cancer or hemoptysis. Report of fever, chills, URI symptoms, productive cough, nausea, vomiting, diarrhea, abdominal pain or back pain. She is not a smoker or drinker. No significant family history of cardiac disease. No heavy lifting or strenuous activities.  History reviewed. No pertinent past medical history.  There are no active problems to display for this patient.   History reviewed. No pertinent surgical history.  OB History    No data available       Home Medications    Prior to Admission medications   Medication Sig Start Date End Date Taking? Authorizing Provider  metroNIDAZOLE (FLAGYL) 500 MG tablet Take 1 tablet (500 mg total) by mouth 2 (two) times daily with a meal. DO NOT CONSUME ALCOHOL WHILE TAKING THIS MEDICATION. 12/14/16   McVey, Madelaine BhatElizabeth Whitney, PA-C  valACYclovir (VALTREX) 1000 MG tablet Take 1 tablet (1,000 mg total) by mouth daily. Use for suppression of outbreaks 06/15/16   Bing NeighborsHarris, Kimberly S, FNP    Family History No family history on file.  Social History Social History  Substance Use Topics  . Smoking status: Never Smoker  . Smokeless tobacco: Never Used  . Alcohol use No      Allergies   Patient has no known allergies.   Review of Systems Review of Systems  All other systems reviewed and are negative.    Physical Exam Updated Vital Signs BP 108/77 (BP Location: Left Arm)   Pulse 75   Temp 97.8 F (36.6 C) (Oral)   Resp 16   Ht 5\' 7"  (1.702 m)   Wt 62.6 kg (138 lb)   LMP 12/21/2016   SpO2 100%   BMI 21.61 kg/m   Physical Exam  Constitutional: She appears well-developed and well-nourished. No distress.  HENT:  Head: Atraumatic.  Eyes: Conjunctivae are normal.  Neck: Neck supple.  Cardiovascular: Normal rate, regular rhythm and intact distal pulses.   Pulmonary/Chest: Effort normal and breath sounds normal.  Abdominal: Soft. Bowel sounds are normal. She exhibits no distension. There is no tenderness.  Musculoskeletal: She exhibits no edema.  Neurological: She is alert.  Skin: No rash noted.  Psychiatric: She has a normal mood and affect.  Nursing note and vitals reviewed.    ED Treatments / Results  Labs (all labs ordered are listed, but only abnormal results are displayed) Labs Reviewed  BASIC METABOLIC PANEL - Abnormal; Notable for the following:       Result Value   CO2 21 (*)    All other components within normal limits  CBC - Abnormal; Notable for the following:    HCT 35.4 (*)    All other components within normal limits  D-DIMER, QUANTITATIVE (NOT  AT [facility]) - Abnormal; Notable for the following:    D-Dimer, Quant 0.80 (*)    All other components within normal limits  I-STAT TROPONIN, ED  I-STAT BETA HCG BLOOD, ED ([facility], [facility], [facility] ONLY)    EKG  EKG Interpretation  Date/Time:  Sunday December 24 2016 00:48:18 EDT Ventricular Rate:  74 PR Interval:  118 QRS Duration: 72 QT Interval:  386 QTC Calculation: 428 R Axis:   77 Text Interpretation:  Normal sinus rhythm Normal ECG Confirmed by Geoffery Lyons ([id number]) on 12/24/2016 1:11:06 AM       Radiology Dg Chest 2 View  Result Date: 12/24/2016 CLINICAL DATA:  20 y/o  F; shortness of breath, chest pain, and chest discomfort for 2 weeks. EXAM: CHEST  2 VIEW COMPARISON:  None. FINDINGS: The heart size and mediastinal contours are within normal limits. Both lungs are clear. The visualized skeletal structures are unremarkable. IMPRESSION: No active cardiopulmonary disease. Electronically Signed   By: Mitzi Hansen M.D.   On: 12/24/2016 01:35   Ct Angio Chest Pe W And/or Wo Contrast  Result Date: 12/24/2016 CLINICAL DATA:  Chest pain and dyspnea for 2 weeks EXAM: CT ANGIOGRAPHY CHEST WITH CONTRAST TECHNIQUE: Multidetector CT imaging of the chest was performed using the standard protocol during bolus administration of intravenous contrast. Multiplanar CT image reconstructions and MIPs were obtained to evaluate the vascular anatomy. CONTRAST:  58 mL Isovue 370 intravenous COMPARISON:  Radiographs 12/24/2016 FINDINGS: Cardiovascular: Satisfactory opacification of the pulmonary arteries to the segmental level. No evidence of pulmonary embolism. Normal heart size. No pericardial effusion. Mediastinum/Nodes: No enlarged mediastinal, hilar, or axillary lymph nodes. Thyroid gland, trachea, and esophagus demonstrate no significant findings. Lungs/Pleura: Lungs are clear. No pleural effusion or pneumothorax. Upper Abdomen: No acute abnormality. Subcentimeter hypodensity in the right hepatic lobe dome, probably benign although not conclusively characterized. Musculoskeletal: No chest wall abnormality. No acute or significant osseous findings. Review of the MIP images confirms the above findings. IMPRESSION: No significant abnormality.  Negative for acute pulmonary embolism. Electronically Signed   By: Ellery Plunk M.D.   On: 12/24/2016 04:22    Procedures Procedures (including critical care time)  Medications Ordered in ED Medications  iopamidol (ISOVUE-370) 76 % injection (100 mLs  Contrast Given 12/24/16 0402)     Initial Impression / Assessment and Plan / ED  Course  I have reviewed the triage vital signs and the nursing notes.  Pertinent labs & imaging results that were available during my care of the patient were reviewed by me and considered in my medical decision making (see chart for details).     BP 109/69   Pulse 68   Temp 97.8 F (36.6 C) (Oral)   Resp 16   Ht 5\' 7"  (1.702 m)   Wt 62.6 kg (138 lb)   LMP 12/21/2016   SpO2 100%   BMI 21.61 kg/m    Final Clinical Impressions(s) / ED Diagnoses   Final diagnoses:  Atypical chest pain    New Prescriptions New Prescriptions   No medications on file   1:50 AM Pt with cp/sob x 2 weeks.  No significant pain currently.  Did travel to [place] 3 weeks ago.  Plan to obtain D-dimer to r/o PE.  Otherwise, doubt ACS based on story.    5:03 AM Mildly elevated d-dimer.  Chest CTA obtained showing no acute finding, specifically no PE.  Pt reassured.  Recommend f/u with PCP for further care.  Return precaution given.  Fayrene Helper, PA-C 12/24/16 [id number]    Geoffery Lyons, MD 12/24/16 [phone]

## 2016-12-24 NOTE — ED Triage Notes (Signed)
Pt c/o intermittent chest pressure and shob x 2 weeks. Pt reports, no strenuous activity in her 2 jobs, denies fever,cough,n/v/d. Pt is in NAD in triage. RR even and unlabored.

## 2016-12-24 NOTE — ED Notes (Signed)
Patient transported to CT scan . 

## 2017-01-09 ENCOUNTER — Encounter: Payer: Self-pay | Admitting: Physician Assistant

## 2017-01-13 ENCOUNTER — Encounter: Payer: Self-pay | Admitting: Physician Assistant

## 2017-01-13 ENCOUNTER — Ambulatory Visit (INDEPENDENT_AMBULATORY_CARE_PROVIDER_SITE_OTHER): Payer: BLUE CROSS/BLUE SHIELD | Admitting: Physician Assistant

## 2017-01-13 VITALS — BP 99/65 | HR 66 | Temp 98.6°F | Resp 16 | Ht 67.0 in | Wt 142.6 lb

## 2017-01-13 DIAGNOSIS — B9689 Other specified bacterial agents as the cause of diseases classified elsewhere: Secondary | ICD-10-CM

## 2017-01-13 DIAGNOSIS — N898 Other specified noninflammatory disorders of vagina: Secondary | ICD-10-CM | POA: Diagnosis not present

## 2017-01-13 DIAGNOSIS — N760 Acute vaginitis: Secondary | ICD-10-CM | POA: Diagnosis not present

## 2017-01-13 LAB — POCT WET + KOH PREP
Trich by wet prep: ABSENT
Yeast by KOH: ABSENT
Yeast by wet prep: ABSENT

## 2017-01-13 MED ORDER — METRONIDAZOLE 500 MG PO TABS: 500.0000 mg | ORAL_TABLET | Freq: Two times a day (BID) | ORAL | 0 refills | Status: DC

## 2017-01-13 NOTE — Patient Instructions (Addendum)
Download the Calm app. Start deep breathing, yoga, meditation, exercising, walking. Try to find way to reduce your stress level. This is different for everyone, so find a technique that fits you. These are techniques that will be very helpful for the rest of your life.  Come back in 4-6 weeks if you are still having trouble relaxing.   Come back if you get BV symptoms again. We can do suppressive therapy.   Generalized Anxiety Disorder, Adult Generalized anxiety disorder (GAD) is a mental health disorder. People with this condition constantly worry about everyday events. Unlike normal anxiety, worry related to GAD is not triggered by a specific event. These worries also do not fade or get better with time. GAD interferes with life functions, including relationships, work, and school. GAD can vary from mild to severe. People with severe GAD can have intense waves of anxiety with physical symptoms (panic attacks). What are the causes? The exact cause of GAD is not known. What increases the risk? This condition is more likely to develop in:  Women.  People who have a family history of anxiety disorders.  People who are very shy.  People who experience very stressful life events, such as the death of a loved one.  People who have a very stressful family environment.  What are the signs or symptoms? People with GAD often worry excessively about many things in their lives, such as their health and family. They may also be overly concerned about:  Doing well at work.  Being on time.  Natural disasters.  Friendships.  Physical symptoms of GAD include:  Fatigue.  Muscle tension or having muscle twitches.  Trembling or feeling shaky.  Being easily startled.  Feeling like your heart is pounding or racing.  Feeling out of breath or like you cannot take a deep breath.  Having trouble falling asleep or staying asleep.  Sweating.  Nausea, diarrhea, or irritable bowel syndrome  (IBS).  Headaches.  Trouble concentrating or remembering facts.  Restlessness.  Irritability.  How is this diagnosed? Your health care provider can diagnose GAD based on your symptoms and medical history. You will also have a physical exam. The health care provider will ask specific questions about your symptoms, including how severe they are, when they started, and if they come and go. Your health care provider may ask you about your use of alcohol or drugs, including prescription medicines. Your health care provider may refer you to a mental health specialist for further evaluation. Your health care provider will do a thorough examination and may perform additional tests to rule out other possible causes of your symptoms. To be diagnosed with GAD, a person must have anxiety that:  Is out of his or her control.  Affects several different aspects of his or her life, such as work and relationships.  Causes distress that makes him or her unable to take part in normal activities.  Includes at least three physical symptoms of GAD, such as restlessness, fatigue, trouble concentrating, irritability, muscle tension, or sleep problems.  Before your health care provider can confirm a diagnosis of GAD, these symptoms must be present more days than they are not, and they must last for six months or longer. How is this treated? The following therapies are usually used to treat GAD:  Medicine. Antidepressant medicine is usually prescribed for long-term daily control. Antianxiety medicines may be added in severe cases, especially when panic attacks occur.  Talk therapy (psychotherapy). Certain types of talk therapy can  be helpful in treating GAD by providing support, education, and guidance. Options include: ? Cognitive behavioral therapy (CBT). People learn coping skills and techniques to ease their anxiety. They learn to identify unrealistic or negative thoughts and behaviors and to replace them  with positive ones. ? Acceptance and commitment therapy (ACT). This treatment teaches people how to be mindful as a way to cope with unwanted thoughts and feelings. ? Biofeedback. This process trains you to manage your body's response (physiological response) through breathing techniques and relaxation methods. You will work with a therapist while machines are used to monitor your physical symptoms.  Stress management techniques. These include yoga, meditation, and exercise.  A mental health specialist can help determine which treatment is best for you. Some people see improvement with one type of therapy. However, other people require a combination of therapies. Follow these instructions at home:  Take over-the-counter and prescription medicines only as told by your health care provider.  Try to maintain a normal routine.  Try to anticipate stressful situations and allow extra time to manage them.  Practice any stress management or self-calming techniques as taught by your health care provider.  Do not punish yourself for setbacks or for not making progress.  Try to recognize your accomplishments, even if they are small.  Keep all follow-up visits as told by your health care provider. This is important. Contact a health care provider if:  Your symptoms do not get better.  Your symptoms get worse.  You have signs of depression, such as: ? A persistently sad, cranky, or irritable mood. ? Loss of enjoyment in activities that used to bring you joy. ? Change in weight or eating. ? Changes in sleeping habits. ? Avoiding friends or family members. ? Loss of energy for normal tasks. ? Feelings of guilt or worthlessness. Get help right away if:  You have serious thoughts about hurting yourself or others. If you ever feel like you may hurt yourself or others, or have thoughts about taking your own life, get help right away. You can go to your nearest emergency department or  call:  Your local emergency services (911 in the [place]  A suicide crisis helpline, such as the National Suicide Prevention Lifeline at 979-713-9242. This is open 24 hours a day.  Summary  Generalized anxiety disorder (GAD) is a mental health disorder that involves worry that is not triggered by a specific event.  People with GAD often worry excessively about many things in their lives, such as their health and family.  GAD may cause physical symptoms such as restlessness, trouble concentrating, sleep problems, frequent sweating, nausea, diarrhea, headaches, and trembling or muscle twitching.  A mental health specialist can help determine which treatment is best for you. Some people see improvement with one type of therapy. However, other people require a combination of therapies. This information is not intended to replace advice given to you by your health care provider. Make sure you discuss any questions you have with your health care provider. Document Released: 08/05/2012 Document Revised: 02/29/2016 Document Reviewed: 02/29/2016 Elsevier Interactive Patient Education  2018 [employer].   IF you received an x-ray today, you will receive an invoice from [facility]. Please contact [facility] at [phone] with questions or concerns regarding your invoice.   IF you received labwork today, you will receive an invoice from [place]. Please contact [facility] at [phone] with questions or concerns regarding your invoice.   Our billing staff will not be able to assist  you with questions regarding bills from these companies.  You will be contacted with the lab results as soon as they are available. The fastest way to get your results is to activate your My Chart account. Instructions are located on the last page of this paperwork. If you have not heard from [place] regarding the results in 2 weeks, please contact this office.

## 2017-01-13 NOTE — Progress Notes (Signed)
Paula Fischer  MRN: [id number] DOB: Oct 12, 1995  PCP: Patient, No Pcp Per  Subjective:  Pt is a pleasant 21 year old female who presents to clinic for vaginal discharge x 1 week.  She was here 9/18, treated for BV and yeast vaginitis with diflucan and flagyl and "got 100% better". After 2 weeks her symptoms came back again. C/o Thicker white discharge and odor. She has not tried any OTC medications. She drinks and eats a lot of sugary foods.  Denies menstrual problem, viginal pain, abdominal pain, back pain, fever, chills, pain with sex, urinary symptoms.  She has intercourse with her boyfriend only. She was negative for GC/chlamydia last OV.   Review of Systems  Constitutional: Negative for chills, fatigue and fever.  Respiratory: Negative for cough, shortness of breath and wheezing.   Cardiovascular: Negative for chest pain and palpitations.  Gastrointestinal: Negative for abdominal pain, diarrhea, nausea and vomiting.  Genitourinary: Positive for vaginal discharge. Negative for decreased urine volume, difficulty urinating, dysuria, enuresis, flank pain, frequency, hematuria and urgency.  Musculoskeletal: Negative for back pain.  Neurological: Negative for dizziness, weakness, light-headedness and headaches.    There are no active problems to display for this patient.   Current Outpatient Prescriptions on File Prior to Visit  Medication Sig Dispense Refill  . ferrous sulfate 325 (65 FE) MG tablet Take 325 mg by mouth daily with breakfast.    . valACYclovir (VALTREX) 1000 MG tablet Take 1 tablet (1,000 mg total) by mouth daily. Use for suppression of outbreaks (Patient taking differently: Take 1,000 mg by mouth daily as needed (outbreaks). ) 90 tablet 3   No current facility-administered medications on file prior to visit.     No Known Allergies   Objective:  BP 99/65   Pulse 66   Temp 98.6 F (37 C) (Oral)   Resp 16   Ht  (1.702 m)   Wt 142 lb 9.6 oz (64.7 kg)   LMP  12/21/2016   SpO2 98%   BMI 22.33 kg/m   Physical Exam  Constitutional: She is oriented to person, place, and time and well-developed, well-nourished, and in no distress. No distress.  Cardiovascular: Normal rate, regular rhythm and normal heart sounds.   Neurological: She is alert and oriented to person, place, and time. GCS score is 15.  Skin: Skin is warm and dry.  Psychiatric: Mood, memory, affect and judgment normal.  Vitals reviewed.  Results for orders placed or performed in visit on 01/13/17  POCT Wet + KOH Prep  Result Value Ref Range   Yeast by KOH Absent Absent   Yeast by wet prep Absent Absent   WBC by wet prep Moderate (A) Few   Clue Cells Wet Prep HPF POC Few (A) None   Trich by wet prep Absent Absent   Bacteria Wet Prep HPF POC None (A) Few   Epithelial Cells By [employer] ([facility]) Many (A) None, Few, Too numerous to count   RBC,UR,HPF,POC None None RBC/hpf    Assessment and Plan :  1. BV (bacterial vaginosis) 2. Vaginal discharge - metroNIDAZOLE (FLAGYL) 500 MG tablet; Take 1 tablet (500 mg total) by mouth 2 (two) times daily with a meal. DO NOT CONSUME ALCOHOL WHILE TAKING THIS MEDICATION.  Dispense: 14 tablet; Refill: 0 - POCT Wet + KOH Prep - Positive clue cells, plan to treat for BV. This is her second BV infection in one month. Discussed with pt possible need to treat with suppositories if she has another  infection this year. Consider boric acid suppositories. Advised pt to improve diet.   Marco Collie, PA-C  Primary Care at [facility] 01/13/2017 8:34 AM

## 2017-01-19 DIAGNOSIS — Z23 Encounter for immunization: Secondary | ICD-10-CM | POA: Diagnosis not present

## 2017-03-05 ENCOUNTER — Other Ambulatory Visit: Payer: Self-pay

## 2017-03-05 ENCOUNTER — Encounter: Payer: Self-pay | Admitting: Family Medicine

## 2017-03-05 ENCOUNTER — Ambulatory Visit: Payer: BLUE CROSS/BLUE SHIELD | Admitting: Family Medicine

## 2017-03-05 VITALS — BP 106/68 | HR 85 | Temp 98.6°F | Resp 16 | Ht 67.0 in | Wt 149.2 lb

## 2017-03-05 DIAGNOSIS — N3000 Acute cystitis without hematuria: Secondary | ICD-10-CM | POA: Diagnosis not present

## 2017-03-05 DIAGNOSIS — R300 Dysuria: Secondary | ICD-10-CM

## 2017-03-05 LAB — POCT URINALYSIS DIP (MANUAL ENTRY)
BILIRUBIN UA: NEGATIVE
BILIRUBIN UA: NEGATIVE mg/dL
Glucose, UA: NEGATIVE mg/dL
NITRITE UA: NEGATIVE
PH UA: 5.5 (ref 5.0–8.0)
PROTEIN UA: NEGATIVE mg/dL
Spec Grav, UA: >=1.03 — AB (ref 1.010–1.025)
Urobilinogen, UA: 0.2 E.U./dL

## 2017-03-05 LAB — POCT URINE PREGNANCY: Preg Test, Ur: NEGATIVE

## 2017-03-05 MED ORDER — NITROFURANTOIN MONOHYD MACRO 100 MG PO CAPS: 100.0000 mg | ORAL_CAPSULE | Freq: Two times a day (BID) | ORAL | 0 refills | Status: AC

## 2017-03-05 NOTE — Patient Instructions (Addendum)
   IF you received an x-ray today, you will receive an invoice from [facility]. Please contact [facility] at [phone] with questions or concerns regarding your invoice.   IF you received labwork today, you will receive an invoice from [facility]. Please contact [facility] at [phone] with questions or concerns regarding your invoice.   Our billing staff will not be able to assist you with questions regarding bills from these companies.  You will be contacted with the lab results as soon as they are available. The fastest way to get your results is to activate your My Chart account. Instructions are located on the last page of this paperwork. If you have not heard from us regarding the results in 2 weeks, please contact this office.     Urinary Tract Infection, Adult A urinary tract infection (UTI) is an infection of any part of the urinary tract, which includes the kidneys, ureters, bladder, and urethra. These organs make, store, and get rid of urine in the body. UTI can be a bladder infection (cystitis) or kidney infection (pyelonephritis). What are the causes? This infection may be caused by fungi, viruses, or bacteria. Bacteria are the most common cause of UTIs. This condition can also be caused by repeated incomplete emptying of the bladder during urination. What increases the risk? This condition is more likely to develop if:  You ignore your need to urinate or hold urine for long periods of time.  You do not empty your bladder completely during urination.  You wipe back to front after urinating or having a bowel movement, if you are female.  You are uncircumcised, if you are female.  You are constipated.  You have a urinary catheter that stays in place (indwelling).  You have a weak defense (immune) system.  You have a medical condition that affects your bowels, kidneys, or bladder.  You have diabetes.  You take antibiotic medicines frequently or  for long periods of time, and the antibiotics no longer work well against certain types of infections (antibiotic resistance).  You take medicines that irritate your urinary tract.  You are exposed to chemicals that irritate your urinary tract.  You are female. What are the signs or symptoms? Symptoms of this condition include:  Fever.  Frequent urination or passing small amounts of urine frequently.  Needing to urinate urgently.  Pain or burning with urination.  Urine that smells bad or unusual.  Cloudy urine.  Pain in the lower abdomen or back.  Trouble urinating.  Blood in the urine.  Vomiting or being less hungry than normal.  Diarrhea or abdominal pain.  Vaginal discharge, if you are female. How is this diagnosed? This condition is diagnosed with a medical history and physical exam. You will also need to provide a urine sample to test your urine. Other tests may be done, including:  Blood tests.  Sexually transmitted disease (STD) testing. If you have had more than one UTI, a cystoscopy or imaging studies may be done to determine the cause of the infections. How is this treated? Treatment for this condition often includes a combination of two or more of the following:  Antibiotic medicine.  Other medicines to treat less common causes of UTI.  Over-the-counter medicines to treat pain.  Drinking enough water to stay hydrated. Follow these instructions at home:  Take over-the-counter and prescription medicines only as told by your health care provider.  If you were prescribed an antibiotic, take it as told by your health care   provider. Do not stop taking the antibiotic even if you start to feel better.  Avoid alcohol, caffeine, tea, and carbonated beverages. They can irritate your bladder.  Drink enough fluid to keep your urine clear or pale yellow.  Keep all follow-up visits as told by your health care provider. This is important.  Make sure  to:  Empty your bladder often and completely. Do not hold urine for long periods of time.  Empty your bladder before and after sex.  Wipe from front to back after a bowel movement if you are female. Use each tissue one time when you wipe. Contact a health care provider if:  You have back pain.  You have a fever.  You feel nauseous or vomit.  Your symptoms do not get better after 3 days.  Your symptoms go away and then return. Get help right away if:  You have severe back pain or lower abdominal pain.  You are vomiting and cannot keep down any medicines or water. This information is not intended to replace advice given to you by your health care provider. Make sure you discuss any questions you have with your health care provider. Document Released: 01/18/2005 Document Revised: 09/22/2015 Document Reviewed: 03/01/2015 Elsevier Interactive Patient Education  2017 Elsevier Inc.  

## 2017-03-05 NOTE — Progress Notes (Signed)
Chief Complaint  Patient presents with  . burning/pressure/foul odor and small amount of urine when sh    x 2 weeks    HPI  Patient reports that she has been having urinary symptoms like pressure and burning with foul odor for 2 weeks  She denies hematuria No fevers or chills No history of UTI or pyelonephritis She denies vaginal discharge No flank pain She has not tried any otc  meds Patient's last menstrual period was 02/14/2017.  She is currently concerned that she might be pregnant She has is not on contraception She does not take prenatal She would be okay conceiving  No past medical history on file.  Current Outpatient Medications  Medication Sig Dispense Refill  . ferrous sulfate 325 (65 FE) MG tablet Take 325 mg by mouth daily with breakfast.    . nitrofurantoin, macrocrystal-monohydrate, (MACROBID) 100 MG capsule Take 1 capsule (100 mg total) 2 (two) times daily for 5 days by mouth. 10 capsule 0   No current facility-administered medications for this visit.     Allergies: No Known Allergies  No past surgical history on file.  Social History   Socioeconomic History  . Marital status: Single    Spouse name: None  . Number of children: None  . Years of education: None  . Highest education level: None  Social Needs  . Financial resource strain: None  . Food insecurity - worry: None  . Food insecurity - inability: None  . Transportation needs - medical: None  . Transportation needs - non-medical: None  Occupational History  . None  Tobacco Use  . Smoking status: Never Smoker  . Smokeless tobacco: Never Used  Substance and Sexual Activity  . Alcohol use: No    Alcohol/week: 0.0 oz  . Drug use: No  . Sexual activity: Yes    Birth control/protection: None, Condom  Other Topics Concern  . None  Social History Narrative  . None    Family History  Problem Relation Age of Onset  . Birth defects Neg Hx      ROS Review of Systems See  HPI Constitution: No fevers or chills No malaise No diaphoresis Skin: No rash or itching Eyes: no blurry vision, no double vision GU: no discharge or hematuria Neuro: no dizziness or headaches    Objective: Vitals:   03/05/17 1007  BP: 106/68  Pulse: 85  Resp: 16  Temp: 98.6 F (37 C)  TempSrc: Oral  SpO2: 99%  Weight: 149 lb 3.2 oz (67.7 kg)  Height: 5\' 7"  (1.702 m)    Physical Exam Physical Exam  Constitutional: She is oriented to person, place, and time. She appears well-developed and well-nourished.  HENT:  Head: Normocephalic and atraumatic.  Eyes: Conjunctivae and EOM are normal.  Cardiovascular: Normal rate, regular rhythm and normal heart sounds.   Pulmonary/Chest: Effort normal and breath sounds normal. No respiratory distress. She has no wheezes.  Abdominal: Normal appearance and bowel sounds are normal. There is no tenderness. There is no CVA tenderness.  Neurological: She is alert and oriented to person, place, and time.    Assessment and Plan Paula Fischer was seen today for burning/pressure/foul odor and small amount of urine when sh.  Diagnoses and all orders for this visit:  Dysuria- positive for UTI with mod LE -     POCT urinalysis dipstick -     POCT urine pregnancy -     Urine Culture  Acute cystitis without hematuria- advised macrobid which is safe in  pregnancy -     Urine Culture  Other orders -     nitrofurantoin, macrocrystal-monohydrate, (MACROBID) 100 MG capsule; Take 1 capsule (100 mg total) 2 (two) times daily for 5 days by mouth.     Yamili Lichtenwalner A Angeliki Mates

## 2017-03-06 ENCOUNTER — Encounter: Payer: Self-pay | Admitting: Family Medicine

## 2017-03-06 LAB — URINE CULTURE

## 2017-03-08 NOTE — Telephone Encounter (Signed)
PT called @ 12:20 pm to check the status of the decision choice of Dr. Creta LevinStallings on whether she will prescribe her some medication for her condition, contact pt if needed

## 2017-03-09 ENCOUNTER — Ambulatory Visit: Payer: BLUE CROSS/BLUE SHIELD | Admitting: Emergency Medicine

## 2017-03-09 NOTE — Telephone Encounter (Signed)
Copied from CRM [phone]#[id number]. Topic: Quick Communication - See Telephone Encounter >> Mar 09, 2017  9:01 AM Landry MellowFoltz, Melissa J wrote: CRM for notification. See Telephone encounter for:   03/09/17. Pt was seen for uti on 11/12 and was given abx. Pr now has yeast infection and would like rx of diflucan. Pt uses [phone] [place] [address]  Pt call back number is [phone]

## 2017-03-19 ENCOUNTER — Encounter: Payer: Self-pay | Admitting: Family Medicine

## 2017-03-19 ENCOUNTER — Ambulatory Visit: Payer: Self-pay | Admitting: Family Medicine

## 2017-03-19 ENCOUNTER — Other Ambulatory Visit: Payer: Self-pay

## 2017-03-19 ENCOUNTER — Ambulatory Visit: Payer: BLUE CROSS/BLUE SHIELD | Admitting: Family Medicine

## 2017-03-19 VITALS — HR 88 | Temp 97.8°F | Resp 16 | Ht 67.0 in | Wt 150.6 lb

## 2017-03-19 DIAGNOSIS — R350 Frequency of micturition: Secondary | ICD-10-CM

## 2017-03-19 DIAGNOSIS — N898 Other specified noninflammatory disorders of vagina: Secondary | ICD-10-CM | POA: Diagnosis not present

## 2017-03-19 DIAGNOSIS — R300 Dysuria: Secondary | ICD-10-CM

## 2017-03-19 DIAGNOSIS — N926 Irregular menstruation, unspecified: Secondary | ICD-10-CM

## 2017-03-19 LAB — POCT URINALYSIS DIPSTICK
BILIRUBIN UA: NEGATIVE
Blood, UA: NEGATIVE
GLUCOSE UA: NEGATIVE
KETONES UA: NEGATIVE
Nitrite, UA: NEGATIVE
PH UA: 7 (ref 5.0–8.0)
Protein, UA: NEGATIVE
Spec Grav, UA: 1.025 (ref 1.010–1.025)
Urobilinogen, UA: 1 E.U./dL

## 2017-03-19 LAB — POC MICROSCOPIC URINALYSIS (UMFC): Mucus: ABSENT

## 2017-03-19 LAB — POCT WET + KOH PREP
TRICH BY WET PREP: ABSENT
Yeast by KOH: ABSENT
Yeast by wet prep: ABSENT

## 2017-03-19 LAB — POCT URINE PREGNANCY: PREG TEST UR: POSITIVE — AB

## 2017-03-19 MED ORDER — COMPLETENATE 29-1 MG PO CHEW
1.0000 | CHEWABLE_TABLET | Freq: Every day | ORAL | 3 refills | Status: AC
Start: 2017-03-19 — End: ?

## 2017-03-19 MED ORDER — AMOXICILLIN 500 MG PO CAPS: 500.0000 mg | ORAL_CAPSULE | Freq: Three times a day (TID) | ORAL | 0 refills | Status: DC

## 2017-03-19 NOTE — Patient Instructions (Addendum)
For burning with urination, start amoxicillin 3 times per day. I will check urine culture again as well as STI testing. If the symptoms do not improve into next week, follow up to discuss other potential causes. Follow-up sooner if any worsening symptoms.  Continue prenatal vitamin once per day. I sent a new prescription to your pharmacy. They can supplement that with prenatal vitamin of choice. Keep follow-up as planned with OB/GYN, but I also listed some other resources for you below in the meantime. Please let me know if you have any questions. ACOG website also has patient information:  https://[email or web address].[email or web address]://[email or web address]  First Trimester of Pregnancy The first trimester of pregnancy is from week 1 until the end of week 13 (months 1 through 3). During this time, your baby will begin to develop inside you. At 6-8 weeks, the eyes and face are formed, and the heartbeat can be seen on ultrasound. At the end of 12 weeks, all the baby's organs are formed. Prenatal care is all the medical care you receive before the birth of your baby. Make sure you get good prenatal care and follow all of your doctor's instructions. Follow these instructions at home: Medicines  Take over-the-counter and prescription medicines only as told by your doctor. Some medicines are safe and some medicines are not safe during pregnancy.  Take a prenatal vitamin that contains at least 600 micrograms (mcg) of folic acid.  If you have trouble pooping (constipation), take medicine that will make your stool soft (stool softener) if your doctor approves. Eating and drinking  Eat regular, healthy meals.  Your doctor will tell you the amount of weight gain that is right for you.  Avoid raw meat and uncooked cheese.  If you feel sick to your stomach (nauseous) or throw up (vomit): ? Eat 4 or 5 small meals a day instead of 3 large meals. ? Try eating a few soda crackers. ? Drink liquids between meals instead of during meals.  To  prevent constipation: ? Eat foods that are high in fiber, like fresh fruits and vegetables, whole grains, and beans. ? Drink enough fluids to keep your pee (urine) clear or pale yellow. Activity  Exercise only as told by your doctor. Stop exercising if you have cramps or pain in your lower belly (abdomen) or low back.  Do not exercise if it is too hot, too humid, or if you are in a place of great height (high altitude).  Try to avoid standing for long periods of time. Move your legs often if you must stand in one place for a long time.  Avoid heavy lifting.  Wear low-heeled shoes. Sit and stand up straight.  You can have sex unless your doctor tells you not to. Relieving pain and discomfort  Wear a good support bra if your breasts are sore.  Take warm water baths (sitz baths) to soothe pain or discomfort caused by hemorrhoids. Use hemorrhoid cream if your doctor says it is okay.  Rest with your legs raised if you have leg cramps or low back pain.  If you have puffy, bulging veins (varicose veins) in your legs: ? Wear support hose or compression stockings as told by your doctor. ? Raise (elevate) your feet for 15 minutes, 3-4 times a day. ? Limit salt in your food. Prenatal care  Schedule your prenatal visits by the twelfth week of pregnancy.  Write down your questions. Take them to your prenatal visits.  Keep all your prenatal visits as told by your  doctor. This is important. Safety  Wear your seat belt at all times when driving.  Make a list of emergency phone numbers. The list should include numbers for family, friends, the hospital, and police and fire departments. General instructions  Ask your doctor for a referral to a local prenatal class. Begin classes no later than at the start of month 6 of your pregnancy.  Ask for help if you need counseling or if you need help with nutrition. Your doctor can give you advice or tell you where to go for help.  Do not use hot  tubs, steam rooms, or saunas.  Do not douche or use tampons or scented sanitary pads.  Do not cross your legs for long periods of time.  Avoid all herbs and alcohol. Avoid drugs that are not approved by your doctor.  Do not use any tobacco products, including cigarettes, chewing tobacco, and electronic cigarettes. If you need help quitting, ask your doctor. You may get counseling or other support to help you quit.  Avoid cat litter boxes and soil used by cats. These carry germs that can cause birth defects in the baby and can cause a loss of your baby (miscarriage) or stillbirth.  Visit your dentist. At home, brush your teeth with a soft toothbrush. Be gentle when you floss. Contact a doctor if:  You are dizzy.  You have mild cramps or pressure in your lower belly.  You have a nagging pain in your belly area.  You continue to feel sick to your stomach, you throw up, or you have watery poop (diarrhea).  You have a bad smelling fluid coming from your vagina.  You have pain when you pee (urinate).  You have increased puffiness (swelling) in your face, hands, legs, or ankles. Get help right away if:  You have a fever.  You are leaking fluid from your vagina.  You have spotting or bleeding from your vagina.  You have very bad belly cramping or pain.  You gain or lose weight rapidly.  You throw up blood. It may look like coffee grounds.  You are around people who have [place] measles, fifth disease, or chickenpox.  You have a very bad headache.  You have shortness of breath.  You have any kind of trauma, such as from a fall or a car accident. Summary  The first trimester of pregnancy is from week 1 until the end of week 13 (months 1 through 3).  To take care of yourself and your unborn baby, you will need to eat healthy meals, take medicines only if your doctor tells you to do so, and do activities that are safe for you and your baby.  Keep all follow-up visits as told  by your doctor. This is important as your doctor will have to ensure that your baby is healthy and growing well. This information is not intended to replace advice given to you by your health care provider. Make sure you discuss any questions you have with your health care provider. Document Released: 09/27/2007 Document Revised: 04/18/2016 Document Reviewed: 04/18/2016 Elsevier Interactive Patient Education  2017 [employer]    IF you received an x-ray today, you will receive an invoice from [facility]. Please contact [facility] at [phone] with questions or concerns regarding your invoice.   IF you received labwork today, you will receive an invoice from [facility]. Please contact [facility] at [phone] with questions or concerns regarding your invoice.   Our billing staff will not be  able to assist you with questions regarding bills from these companies.  You will be contacted with the lab results as soon as they are available. The fastest way to get your results is to activate your My Chart account. Instructions are located on the last page of this paperwork. If you have not heard from [place] regarding the results in 2 weeks, please contact this office.

## 2017-03-19 NOTE — Progress Notes (Signed)
Subjective:  By signing my name below, I, Essence Howell, attest that this documentation has been prepared under the direction and in the presence of Shade FloodJeffrey R Mardell Suttles, MD Electronically Signed: Charline BillsEssence Howell, ED Scribe 03/19/2017 at 12:04 PM.   Patient ID: Carollee HerterBrea Hisle, female    DOB: 12-Feb-1996, 21 y.o.   MRN: [id number]  Chief Complaint  Patient presents with  . Dysuria    patient states that she developed yeast infection after completeing antibiotic for UTI, also states that she took an at home pregnancy test, positive.   HPI Carollee HerterBrea Jurczyk is a 21 y.o. female who presents to Primary Care at [facility] for f/u. She was seen by Dr. Creta LevinStallings on 11/12 with pressure and dysuria as well as odor x 2 weeks. LMP listed as 02/14/17 at that visit. UA positive for LE. HCG was negative at that time. Initially treated with Macrobid. Urine culture now review mixed flora less than 10,000 colonies. Presents today for f/u and positive home pregnancy test yesterday.  This is pt's first pregnancy. States both her and her partner weren't really planning for a pregnancy but were okay with being pregnant. She is scheduled with [facility] on 12/19. She started prenatal vitamins yesterday after completing home pregnancy test. Pt is still having dysuria and vaginal discharge which has improved some with taking Macrobid. Also reports urinary frequency that she attributes to pregnancy and noticed vaginal itching the second day of taking the antibiotic which has since subsided with OTC Monistat cream. Denies fever, nausea, vomiting, vaginal bleeding, unusual breast tenderness. Pt is a nonsmoker.  There are no active problems to display for this patient.  No past medical history on file. No past surgical history on file. No Known Allergies Prior to Admission medications   Medication Sig Start Date End Date Taking? Authorizing Provider  ferrous sulfate 325 (65 FE) MG tablet Take 325 mg by mouth daily with breakfast.     [provider]   Social History   Socioeconomic History  . Marital status: Single    Spouse name: Not on file  . Number of children: Not on file  . Years of education: Not on file  . Highest education level: Not on file  Social Needs  . Financial resource strain: Not on file  . Food insecurity - worry: Not on file  . Food insecurity - inability: Not on file  . Transportation needs - medical: Not on file  . Transportation needs - non-medical: Not on file  Occupational History  . Not on file  Tobacco Use  . Smoking status: Never Smoker  . Smokeless tobacco: Never Used  Substance and Sexual Activity  . Alcohol use: No    Alcohol/week: 0.0 oz  . Drug use: No  . Sexual activity: Yes    Birth control/protection: None, Condom  Other Topics Concern  . Not on file  Social History Narrative  . Not on file   Review of Systems  Constitutional: Negative for fever.  Gastrointestinal: Negative for nausea and vomiting.  Genitourinary: Positive for dysuria (improving), frequency and vaginal discharge (improving). Negative for vaginal bleeding.      Objective:   Physical Exam  Constitutional: She is oriented to person, place, and time. She appears well-developed and well-nourished. No distress.  HENT:  Head: Normocephalic and atraumatic.  Eyes: Conjunctivae and EOM are normal.  Neck: Neck supple. No tracheal deviation present.  Cardiovascular: Normal rate and regular rhythm.  Pulmonary/Chest: Effort normal and breath sounds normal. No respiratory  distress.  Musculoskeletal: Normal range of motion.  Neurological: She is alert and oriented to person, place, and time.  Skin: Skin is warm and dry.  Psychiatric: She has a normal mood and affect. Her behavior is normal.  Nursing note and vitals reviewed.  Vitals:   03/19/17 1153  Pulse: 88  Resp: 16  Temp: 97.8 F (36.6 C)  TempSrc: Oral  SpO2: 100%  Weight: 150 lb 9.6 oz (68.3 kg)  Height: 5\' 7"  (1.702 m)   Results  for orders placed or performed in visit on 03/19/17  POCT urine pregnancy  Result Value Ref Range   Preg Test, Ur Positive (A) Negative  POCT urinalysis dipstick  Result Value Ref Range   Color, UA yellow    Clarity, UA clear    Glucose, UA negative    Bilirubin, UA negative    Ketones, UA negative    Spec Grav, UA 1.025 1.010 - 1.025   Blood, UA negative    pH, UA 7.0 5.0 - 8.0   Protein, UA negative    Urobilinogen, UA 1.0 0.2 or 1.0 E.U./dL   Nitrite, UA negative    Leukocytes, UA Trace (A) Negative  POCT Wet + KOH Prep  Result Value Ref Range   Yeast by KOH Absent Absent   Yeast by wet prep Absent Absent   WBC by wet prep Few Few   Clue Cells Wet Prep HPF POC None None   Trich by wet prep Absent Absent   Bacteria Wet Prep HPF POC Moderate (A) Few   Epithelial Cells By [employer] ([facility]) Many (A) None, Few, Too numerous to count   RBC,UR,HPF,POC None None RBC/hpf  POCT Microscopic Urinalysis ([facility])  Result Value Ref Range   WBC,UR,HPF,POC Moderate (A) None WBC/hpf   RBC,UR,HPF,POC None None RBC/hpf   Bacteria Few (A) None, Too numerous to count   Mucus Absent Absent   Epithelial Cells, UR Per Microscopy Few (A) None, Too numerous to count cells/hpf    Results for orders placed or performed in visit on 03/19/17  POCT urine pregnancy  Result Value Ref Range   Preg Test, Ur Positive (A) Negative  POCT urinalysis dipstick  Result Value Ref Range   Color, UA yellow    Clarity, UA clear    Glucose, UA negative    Bilirubin, UA negative    Ketones, UA negative    Spec Grav, UA 1.025 1.010 - 1.025   Blood, UA negative    pH, UA 7.0 5.0 - 8.0   Protein, UA negative    Urobilinogen, UA 1.0 0.2 or 1.0 E.U./dL   Nitrite, UA negative    Leukocytes, UA Trace (A) Negative      Assessment & Plan:    Corrinna Karapetyan is a 21 y.o. female Frequency of urination - Plan: POCT urine pregnancy, POCT urinalysis dipstick, Urine Culture, GC/Chlamydia Probe Amp Dysuria - Plan: POCT  Microscopic Urinalysis ([facility]), Urine Culture, GC/Chlamydia Probe Amp  - Persistent burning, positive LE with white blood cells on microscopy, as well as bacteruia.  Prior urine culture reassuring, but with persistent symptoms and now new diagnosis of pregnancy, will start amoxicillin 500 mg 3 times a day for 7 days. Repeat urine culture, also check for Chlamydia/gonorrhea. RTC precautions.   Vaginal discharge - Plan: POCT Wet + KOH Prep, GC/Chlamydia Probe Amp  -No sign of yeast infection - may have treated at home. Check GC/CT testing as above.  Late menses - Plan: prenatal vitamin w/FE, FA (NATACHEW)  29-1 MG CHEW chewable tablet  -Positive pregnancy testing. Early pregnancy. Denies any acute symptoms and has started prenatal vitamins. Appointment with gynecology pending, prescription prenatal vitamins were provided as well as handout on first trimester pregnancy, patient resources through ACOG.  Meds ordered this encounter  Medications  . prenatal vitamin w/FE, FA (NATACHEW) 29-1 MG CHEW chewable tablet    Sig: Chew 1 tablet by mouth daily at 12 noon.    Dispense:  30 tablet    Refill:  3    Prenatal vitamin of choice - may supplement.  Marland Kitchen amoxicillin (AMOXIL) 500 MG capsule    Sig: Take 1 capsule (500 mg total) by mouth 3 (three) times daily.    Dispense:  21 capsule    Refill:  0   Patient Instructions   For burning with urination, start amoxicillin 3 times per day. I will check urine culture again as well as STI testing. If the symptoms do not improve into next week, follow up to discuss other potential causes. Follow-up sooner if any worsening symptoms.  Continue prenatal vitamin once per day. I sent a new prescription to your pharmacy. They can supplement that with prenatal vitamin of choice. Keep follow-up as planned with OB/GYN, but I also listed some other resources for you below in the meantime. Please let me know if you have any questions. ACOG website also has patient  information:  [email or web address].info/  First Trimester of Pregnancy The first trimester of pregnancy is from week 1 until the end of week 13 (months 1 through 3). During this time, your baby will begin to develop inside you. At 6-8 weeks, the eyes and face are formed, and the heartbeat can be seen on ultrasound. At the end of 12 weeks, all the baby's organs are formed. Prenatal care is all the medical care you receive before the birth of your baby. Make sure you get good prenatal care and follow all of your doctor's instructions. Follow these instructions at home: Medicines  Take over-the-counter and prescription medicines only as told by your doctor. Some medicines are safe and some medicines are not safe during pregnancy.  Take a prenatal vitamin that contains at least 600 micrograms (mcg) of folic acid.  If you have trouble pooping (constipation), take medicine that will make your stool soft (stool softener) if your doctor approves. Eating and drinking  Eat regular, healthy meals.  Your doctor will tell you the amount of weight gain that is right for you.  Avoid raw meat and uncooked cheese.  If you feel sick to your stomach (nauseous) or throw up (vomit): ? Eat 4 or 5 small meals a day instead of 3 large meals. ? Try eating a few soda crackers. ? Drink liquids between meals instead of during meals.  To prevent constipation: ? Eat foods that are high in fiber, like fresh fruits and vegetables, whole grains, and beans. ? Drink enough fluids to keep your pee (urine) clear or pale yellow. Activity  Exercise only as told by your doctor. Stop exercising if you have cramps or pain in your lower belly (abdomen) or low back.  Do not exercise if it is too hot, too humid, or if you are in a place of great height (high altitude).  Try to avoid standing for long periods of time. Move your legs often if you must stand in one place for a long time.  Avoid heavy lifting.  Wear  low-heeled shoes. Sit and stand up straight.  You can have  sex unless your doctor tells you not to. Relieving pain and discomfort  Wear a good support bra if your breasts are sore.  Take warm water baths (sitz baths) to soothe pain or discomfort caused by hemorrhoids. Use hemorrhoid cream if your doctor says it is okay.  Rest with your legs raised if you have leg cramps or low back pain.  If you have puffy, bulging veins (varicose veins) in your legs: ? Wear support hose or compression stockings as told by your doctor. ? Raise (elevate) your feet for 15 minutes, 3-4 times a day. ? Limit salt in your food. Prenatal care  Schedule your prenatal visits by the twelfth week of pregnancy.  Write down your questions. Take them to your prenatal visits.  Keep all your prenatal visits as told by your doctor. This is important. Safety  Wear your seat belt at all times when driving.  Make a list of emergency phone numbers. The list should include numbers for family, friends, the hospital, and police and fire departments. General instructions  Ask your doctor for a referral to a local prenatal class. Begin classes no later than at the start of month 6 of your pregnancy.  Ask for help if you need counseling or if you need help with nutrition. Your doctor can give you advice or tell you where to go for help.  Do not use hot tubs, steam rooms, or saunas.  Do not douche or use tampons or scented sanitary pads.  Do not cross your legs for long periods of time.  Avoid all herbs and alcohol. Avoid drugs that are not approved by your doctor.  Do not use any tobacco products, including cigarettes, chewing tobacco, and electronic cigarettes. If you need help quitting, ask your doctor. You may get counseling or other support to help you quit.  Avoid cat litter boxes and soil used by cats. These carry germs that can cause birth defects in the baby and can cause a loss of your baby (miscarriage) or  stillbirth.  Visit your dentist. At home, brush your teeth with a soft toothbrush. Be gentle when you floss. Contact a doctor if:  You are dizzy.  You have mild cramps or pressure in your lower belly.  You have a nagging pain in your belly area.  You continue to feel sick to your stomach, you throw up, or you have watery poop (diarrhea).  You have a bad smelling fluid coming from your vagina.  You have pain when you pee (urinate).  You have increased puffiness (swelling) in your face, hands, legs, or ankles. Get help right away if:  You have a fever.  You are leaking fluid from your vagina.  You have spotting or bleeding from your vagina.  You have very bad belly cramping or pain.  You gain or lose weight rapidly.  You throw up blood. It may look like coffee grounds.  You are around people who have [place] measles, fifth disease, or chickenpox.  You have a very bad headache.  You have shortness of breath.  You have any kind of trauma, such as from a fall or a car accident. Summary  The first trimester of pregnancy is from week 1 until the end of week 13 (months 1 through 3).  To take care of yourself and your unborn baby, you will need to eat healthy meals, take medicines only if your doctor tells you to do so, and do activities that are safe for you and your baby.  Keep all follow-up visits as told by your doctor. This is important as your doctor will have to ensure that your baby is healthy and growing well. This information is not intended to replace advice given to you by your health care provider. Make sure you discuss any questions you have with your health care provider. Document Released: 09/27/2007 Document Revised: 04/18/2016 Document Reviewed: 04/18/2016 Elsevier Interactive Patient Education  2017 [employer].    IF you received an x-ray today, you will receive an invoice from [facility]. Please contact [facility] at [phone]  with questions or concerns regarding your invoice.   IF you received labwork today, you will receive an invoice from [facility]. Please contact [facility] at [phone] with questions or concerns regarding your invoice.   Our billing staff will not be able to assist you with questions regarding bills from these companies.  You will be contacted with the lab results as soon as they are available. The fastest way to get your results is to activate your My Chart account. Instructions are located on the last page of this paperwork. If you have not heard from [place] regarding the results in 2 weeks, please contact this office.      I personally performed the services described in this documentation, which was scribed in my presence. The recorded information has been reviewed and considered for accuracy and completeness, addended by me as needed, and agree with information above.  Signed,   Meredith Staggers, MD Primary Care at [facility].  03/19/17 1:03 PM

## 2017-03-20 LAB — GC/CHLAMYDIA PROBE AMP
CHLAMYDIA, DNA PROBE: NEGATIVE
NEISSERIA GONORRHOEAE BY PCR: NEGATIVE

## 2017-03-20 LAB — URINE CULTURE

## 2017-03-22 ENCOUNTER — Ambulatory Visit: Payer: Self-pay

## 2017-03-22 NOTE — Telephone Encounter (Signed)
Pt. called to question what she can take OTC for cold symptoms, since she is [redacted] weeks pregnant.  Reported she started having clear nasal drainage and sore throat last night.  Denied fever.  Denied cough.  Denied any resp. distress.  Care advice given for cold symptoms, with modifications.  Advised to contact her OB doctor to get information on what medications she can take OTC during her pregnancy.    Pt. Agreed.        Reason for Disposition . Cold with no complications  Answer Assessment - Initial Assessment Questions 1. ONSET: "When did the nasal discharge start?"      Started last night 2. AMOUNT: "How much discharge is there?"      Clear discharge from nose  3. COUGH: "Do you have a cough?" If yes, ask: "Describe the color of your sputum" (clear, white, yellow, green)     No cough 4. RESPIRATORY DISTRESS: "Describe your breathing."      no 5. FEVER: "Do you have a fever?" If so, ask: "What is your temperature, how was it measured, and when did it start?"     No  6. SEVERITY: "Overall, how bad are you feeling right now?" (e.g., doesn't interfere with normal activities, staying home from school/work, staying in bed)      Mainly sore throat 7. OTHER SYMPTOMS: "Do you have any other symptoms?" (e.g., sore throat, earache, wheezing, vomiting)     No wheezing; left ear hurting a little when outside  8. PREGNANCY: "Is there any chance you are pregnant?" "When was your last menstrual period?"     5 weeks  Protocols used: COMMON COLD-A-AH

## 2017-03-28 DIAGNOSIS — O200 Threatened abortion: Secondary | ICD-10-CM | POA: Diagnosis not present

## 2017-03-28 DIAGNOSIS — Z3A01 Less than 8 weeks gestation of pregnancy: Secondary | ICD-10-CM | POA: Diagnosis not present

## 2017-03-28 DIAGNOSIS — Z3201 Encounter for pregnancy test, result positive: Secondary | ICD-10-CM | POA: Diagnosis not present

## 2017-03-28 DIAGNOSIS — N911 Secondary amenorrhea: Secondary | ICD-10-CM | POA: Diagnosis not present

## 2017-03-28 DIAGNOSIS — O209 Hemorrhage in early pregnancy, unspecified: Secondary | ICD-10-CM | POA: Diagnosis not present

## 2017-04-03 ENCOUNTER — Encounter: Payer: Self-pay | Admitting: Family Medicine

## 2017-04-07 ENCOUNTER — Encounter: Payer: Self-pay | Admitting: Physician Assistant

## 2017-04-07 ENCOUNTER — Other Ambulatory Visit: Payer: Self-pay

## 2017-04-07 ENCOUNTER — Ambulatory Visit: Payer: BLUE CROSS/BLUE SHIELD | Admitting: Physician Assistant

## 2017-04-07 VITALS — BP 98/62 | HR 82 | Temp 98.3°F | Resp 16 | Ht 66.75 in | Wt 151.2 lb

## 2017-04-07 DIAGNOSIS — B373 Candidiasis of vulva and vagina: Secondary | ICD-10-CM | POA: Diagnosis not present

## 2017-04-07 DIAGNOSIS — R8281 Pyuria: Secondary | ICD-10-CM

## 2017-04-07 DIAGNOSIS — N390 Urinary tract infection, site not specified: Secondary | ICD-10-CM | POA: Diagnosis not present

## 2017-04-07 DIAGNOSIS — N898 Other specified noninflammatory disorders of vagina: Secondary | ICD-10-CM | POA: Diagnosis not present

## 2017-04-07 DIAGNOSIS — R300 Dysuria: Secondary | ICD-10-CM | POA: Diagnosis not present

## 2017-04-07 DIAGNOSIS — R350 Frequency of micturition: Secondary | ICD-10-CM

## 2017-04-07 DIAGNOSIS — B3731 Acute candidiasis of vulva and vagina: Secondary | ICD-10-CM

## 2017-04-07 LAB — POCT URINALYSIS DIP (MANUAL ENTRY)
BILIRUBIN UA: NEGATIVE mg/dL
Bilirubin, UA: NEGATIVE
Glucose, UA: NEGATIVE mg/dL
Leukocytes, UA: NEGATIVE
NITRITE UA: NEGATIVE
Protein Ur, POC: NEGATIVE mg/dL
RBC UA: NEGATIVE
SPEC GRAV UA: 1.02 (ref 1.010–1.025)
Urobilinogen, UA: 0.2 E.U./dL
pH, UA: 7 (ref 5.0–8.0)

## 2017-04-07 LAB — POCT WET + KOH PREP
Trich by wet prep: ABSENT
YEAST BY WET PREP: ABSENT

## 2017-04-07 LAB — POC MICROSCOPIC URINALYSIS (UMFC): Mucus: ABSENT

## 2017-04-07 MED ORDER — CEPHALEXIN 500 MG PO CAPS: 500.0000 mg | ORAL_CAPSULE | Freq: Two times a day (BID) | ORAL | 0 refills | Status: AC

## 2017-04-07 MED ORDER — MICONAZOLE NITRATE 2 % VA CREA: 1.0000 | TOPICAL_CREAM | Freq: Every day | VAGINAL | 0 refills | Status: AC

## 2017-04-07 NOTE — Progress Notes (Signed)
04/07/2017 at 9:43 AM  Paula Fischer / DOB: [birth year] / MRN: [id number]  The patient does not have a problem list on file.  SUBJECTIVE  Paula HerterBrea Leppert is a 21 y.o. female who complains of dysuria and urinary frequency x 7 weeks.. She is currently [redacted] weeks pregnant. She urinates 2-3 x an hour. Drinks about 60 oz of fluids a day. No caffeine. Has been seen in office twice over the past month for this. Urine culture each time with <10,000 colonies. Negative wet prep and GC/Chlamydia.  She denies hematuria, urinary urgency, flank pain, abdominal pain, pelvic pain, cloudy malordorous urine, genital rash, genital irritation and vaginal discharge. Has tried azo and two rounds of antibiotics (macrobid and amoxicillin) with no full relief. Most recent UTI prior to this was 03/19/17. Follow up with gyn is 04/30/17.   She  has no past medical history on file.    Medications reviewed and updated by myself where necessary, and exist elsewhere in the encounter.   Ms. Alben SpittleWeaver has No Known Allergies. She  reports that  has never smoked. she has never used smokeless tobacco. She reports that she does not drink alcohol or use drugs. She  reports that she currently engages in sexual activity. She reports using the following methods of birth control/protection: None and Condom. The patient  has no past surgical history on file.  Her family history is not on file.  Review of Systems  Constitutional: Negative for chills, diaphoresis and fever.  Gastrointestinal: Negative for nausea and vomiting.    OBJECTIVE  Her  height is 5' 6.75" (1.695 m) and weight is 151 lb 3.2 oz (68.6 kg). Her oral temperature is 98.3 F (36.8 C). Her blood pressure is 98/62 and her pulse is 82. Her respiration is 16 and oxygen saturation is 99%.  The patient's body mass index is 23.86 kg/m.  Physical Exam  Constitutional: She is oriented to person, place, and time. She appears well-developed and well-nourished.  HENT:  Head: Normocephalic  and atraumatic.  Eyes: Conjunctivae are normal.  Neck: Normal range of motion.  Pulmonary/Chest: Effort normal.  Abdominal: Soft. There is no tenderness. There is no CVA tenderness.  Genitourinary: Uterus normal. There is no rash on the right labia. There is no rash on the left labia. Cervix exhibits no motion tenderness. Right adnexum displays no mass and no tenderness. Left adnexum displays no mass and no tenderness. Vaginal discharge (mild amount of thick white discharge) found.  Neurological: She is alert and oriented to person, place, and time.  Skin: Skin is warm and dry.  Psychiatric: She has a normal mood and affect.  Vitals reviewed.   Results for orders placed or performed in visit on 04/07/17 (from the past 24 hour(s))  POCT urinalysis dipstick     Status: None   Collection Time: 04/07/17  8:20 AM  Result Value Ref Range   Color, UA yellow yellow   Clarity, UA clear clear   Glucose, UA negative negative mg/dL   Bilirubin, UA negative negative   Ketones, POC UA negative negative mg/dL   Spec Grav, UA 4.0981.020 [id number] - 1.025   Blood, UA negative negative   pH, UA 7.0 5.0 - 8.0   Protein Ur, POC negative negative mg/dL   Urobilinogen, UA 0.2 0.2 or 1.0 E.U./dL   Nitrite, UA Negative Negative   Leukocytes, UA Negative Negative  POCT Microscopic Urinalysis ([facility])     Status: Abnormal   Collection Time: 04/07/17  8:33 AM  Result  Value Ref Range   WBC,UR,HPF,POC Few (A) None WBC/hpf   RBC,UR,HPF,POC None None RBC/hpf   Bacteria Few (A) None, Too numerous to count   Mucus Absent Absent   Epithelial Cells, UR Per Microscopy None None, Too numerous to count cells/hpf  POCT Wet + KOH Prep     Status: Abnormal   Collection Time: 04/07/17  8:50 AM  Result Value Ref Range   Yeast by KOH Present (A) Absent   Yeast by wet prep Absent Absent   WBC by wet prep None (A) Few   Clue Cells Wet Prep HPF POC None None   Trich by wet prep Absent Absent   Bacteria Wet Prep HPF POC Few Few    Epithelial Cells By [employer] ([facility]) Few None, Few, Too numerous to count   RBC,UR,HPF,POC None None RBC/hpf    ASSESSMENT & PLAN  Gracelyn NurseBrea was seen today for urinary frequency and depression.  Diagnoses and all orders for this visit:  Urinary frequency -     POCT urinalysis dipstick -     POCT Microscopic Urinalysis ([facility])  Dysuria  Pyuria -     Urine Culture -     GC/Chlamydia Probe Amp -     cephALEXin (KEFLEX) 500 MG capsule; Take 1 capsule (500 mg total) by mouth 2 (two) times daily for 5 days.  Vaginal discharge -     POCT Wet + KOH Prep  Yeast vaginitis -     miconazole (MICONAZOLE 7) 2 % vaginal cream; Place 1 Applicatorful vaginally at bedtime for 7 days.  This case was precepted with Dr. Leretha PolSantiago.  UA with no nitrites or leukocytes.  Urine micro with few white blood cells and bacteria.  Due to the fact that patient is pregnant and symptomatic with white blood cells on urine micro, will treat empirically for UTI at this time.  Urine culture pending.  Patient also has thick white vaginal discharge noted on exam and positive yeast on wet prep.  Will treat for yeast infection with topical miconazole cream.  Patient educated on hygiene techniques to help prevent recurrent UTIs.  Recommended follow-up with gynecology before her appointment on 04/30/17 for discussion of recurrent UTIs. The patient was advised to call or come back to clinic if she does not see an improvement in symptoms, or worsens with the above plan.   Benjiman CoreBrittany Wiseman, PA-C  Primary Care at [facility] 04/07/2017 9:45 AM

## 2017-04-07 NOTE — Patient Instructions (Addendum)
  Your urine did have some white blood cells so we are going to treat empirically for UTI with keflex. Please take as prescribed. You also have some yeast, which we will treat with a topical cream.  I have sent off a urine culture and we should have those results in 48 hours. If your symptoms worsen while you are awaiting these results or you develop fever, chills, flank pian, nausea and vomiting, please seek care immediately.   I also recommend you contact your gynecologist before your appointment on 1/7 to discuss frequent UTIs.   Thank you for letting me participate in your health and well being.   Urinary Tract Infection, Adult A urinary tract infection (UTI) is an infection of any part of the urinary tract. The urinary tract includes the:  Kidneys.  Ureters.  Bladder.  Urethra.  These organs make, store, and get rid of pee (urine) in the body. Follow these instructions at home:  Take over-the-counter and prescription medicines only as told by your doctor.  If you were prescribed an antibiotic medicine, take it as told by your doctor. Do not stop taking the antibiotic even if you start to feel better.  Avoid the following drinks: ? Alcohol. ? Caffeine. ? Tea. ? Carbonated drinks.  Drink enough fluid to keep your pee clear or pale yellow.  Keep all follow-up visits as told by your doctor. This is important.  Make sure to: ? Empty your bladder often and completely. Do not to hold pee for long periods of time. ? Empty your bladder before and after sex. ? Wipe from front to back after a bowel movement if you are female. Use each tissue one time when you wipe. Contact a doctor if:  You have back pain.  You have a fever.  You feel sick to your stomach (nauseous).  You throw up (vomit).  Your symptoms do not get better after 3 days.  Your symptoms go away and then come back. Get help right away if:  You have very bad back pain.  You have very bad lower belly  (abdominal) pain.  You are throwing up and cannot keep down any medicines or water. This information is not intended to replace advice given to you by your health care provider. Make sure you discuss any questions you have with your health care provider. Document Released: 09/27/2007 Document Revised: 09/16/2015 Document Reviewed: 03/01/2015 Elsevier Interactive Patient Education  2018 [employer]   IF you received an x-ray today, you will receive an invoice from [facility]. Please contact [facility] at [phone] with questions or concerns regarding your invoice.   IF you received labwork today, you will receive an invoice from [facility]. Please contact [facility] at [phone] with questions or concerns regarding your invoice.   Our billing staff will not be able to assist you with questions regarding bills from these companies.  You will be contacted with the lab results as soon as they are available. The fastest way to get your results is to activate your My Chart account. Instructions are located on the last page of this paperwork. If you have not heard from us regarding the results in 2 weeks, please contact this office.

## 2017-04-08 LAB — URINE CULTURE: ORGANISM ID, BACTERIA: NO GROWTH

## 2017-04-10 LAB — GC/CHLAMYDIA PROBE AMP
Chlamydia trachomatis, NAA: NEGATIVE
NEISSERIA GONORRHOEAE BY PCR: NEGATIVE

## 2017-05-04 ENCOUNTER — Other Ambulatory Visit: Payer: Self-pay

## 2017-05-04 ENCOUNTER — Ambulatory Visit: Payer: BLUE CROSS/BLUE SHIELD | Admitting: Physician Assistant

## 2017-05-04 ENCOUNTER — Encounter: Payer: Self-pay | Admitting: Physician Assistant

## 2017-05-04 VITALS — BP 110/82 | HR 110 | Temp 99.1°F | Resp 16 | Ht 68.11 in | Wt 146.0 lb

## 2017-05-04 DIAGNOSIS — R350 Frequency of micturition: Secondary | ICD-10-CM | POA: Diagnosis not present

## 2017-05-04 DIAGNOSIS — R82998 Other abnormal findings in urine: Secondary | ICD-10-CM | POA: Diagnosis not present

## 2017-05-04 DIAGNOSIS — R112 Nausea with vomiting, unspecified: Secondary | ICD-10-CM

## 2017-05-04 LAB — POCT URINALYSIS DIP (MANUAL ENTRY)
Bilirubin, UA: NEGATIVE
Glucose, UA: NEGATIVE mg/dL
NITRITE UA: NEGATIVE
PH UA: 7 (ref 5.0–8.0)
PROTEIN UA: NEGATIVE mg/dL
RBC UA: NEGATIVE
Spec Grav, UA: 1.02 (ref 1.010–1.025)
UROBILINOGEN UA: 0.2 U/dL

## 2017-05-04 MED ORDER — VITAMIN B-6 25 MG PO TABS: 25.0000 mg | ORAL_TABLET | Freq: Three times a day (TID) | ORAL | 0 refills | Status: AC

## 2017-05-04 MED ORDER — DOXYLAMINE SUCCINATE (SLEEP) 25 MG PO TABS: 12.5000 mg | ORAL_TABLET | Freq: Two times a day (BID) | ORAL | 0 refills | Status: AC | PRN

## 2017-05-04 NOTE — Patient Instructions (Addendum)
  I have given you a prescription for doxylamine and proximal need to use as prescribed for nausea and vomiting.  I also recommend continue with ginger ale and light bland meals, which should not aggravate the nausea.  If your vomiting worsens and you are unable to hold down any fluids, please seek care immediately.  Follow-up with your gynecologist on Monday as planned.  Your urine once again showed some luekocytes. I have sent off a culture and will contact you with these results.  Morning Sickness Morning sickness is when you feel sick to your stomach (nauseous) during pregnancy. You may feel sick to your stomach and throw up (vomit). You may feel sick in the morning, but you can feel this way any time of day. Some women feel very sick to their stomach and cannot stop throwing up (hyperemesis gravidarum). Follow these instructions at home:  Only take medicines as told by your doctor.  Take multivitamins as told by your doctor. Taking multivitamins before getting pregnant can stop or lessen the harshness of morning sickness.  Eat dry toast or unsalted crackers before getting out of bed.  Eat 5 to 6 small meals a day.  Eat dry and bland foods like rice and baked potatoes.  Do not drink liquids with meals. Drink between meals.  Do not eat greasy, fatty, or spicy foods.  Have someone cook for you if the smell of food causes you to feel sick or throw up.  If you feel sick to your stomach after taking prenatal vitamins, take them at night or with a snack.  Eat protein when you need a snack (nuts, yogurt, cheese).  Eat unsweetened gelatins for dessert.  Wear a bracelet used for sea sickness (acupressure wristband).  Go to a doctor that puts thin needles into certain body points (acupuncture) to improve how you feel.  Do not smoke.  Use a humidifier to keep the air in your house free of odors.  Get lots of fresh air. Contact a doctor if:  You need medicine to feel better.  You  feel dizzy or lightheaded.  You are losing weight. Get help right away if:  You feel very sick to your stomach and cannot stop throwing up.  You pass out (faint). This information is not intended to replace advice given to you by your health care provider. Make sure you discuss any questions you have with your health care provider. Document Released: 05/18/2004 Document Revised: 09/16/2015 Document Reviewed: 09/25/2012 Elsevier Interactive Patient Education  2017 [employer]    IF you received an x-ray today, you will receive an invoice from [facility]. Please contact [facility] at [phone] with questions or concerns regarding your invoice.   IF you received labwork today, you will receive an invoice from [facility]. Please contact [facility] at [phone] with questions or concerns regarding your invoice.   Our billing staff will not be able to assist you with questions regarding bills from these companies.  You will be contacted with the lab results as soon as they are available. The fastest way to get your results is to activate your My Chart account. Instructions are located on the last page of this paperwork. If you have not heard from us regarding the results in 2 weeks, please contact this office.

## 2017-05-04 NOTE — Progress Notes (Signed)
Paula Fischer  MRN: [id number] DOB: [birth year]  Subjective:  Paula Fischer is a 22 y.o. female seen in office today for a chief complaint of nausea and vomiting.  Of note, patient is [redacted] weeks pregnant.Nausea started at 6 weeks pregnancy. Vomiting started last week. Vomiting at least once or twice a day, typically right after she eats in the evening. Has tried ginger ale and that does help.Has follow up appointment with OB in three days.  She is continuing to drink fluids and eat light meals.  Denies hematemesis, fever, chills, dizziness, lightheadedness, abdominal pain, vaginal bleeding, congestion, cough, sore throat, or body aches.Does have some urinary frequency and mild dysuria. This has been noted since she become pregnant. Last urine culture on 04/07/17 showed no growth.  She was supposed to discuss this with gynecology but she forgot to at her last visit.  She denies hematuria, flank pain, and urgency.  She is taking prenatal vitamin as prescribed.  Review of Systems  Constitutional: Positive for fatigue. Negative for activity change, diaphoresis and unexpected weight change.  Gastrointestinal: Negative for constipation and diarrhea.  Neurological: Negative for weakness.    There are no active problems to display for this patient.   Current Outpatient Medications on File Prior to Visit  Medication Sig Dispense Refill  . ferrous sulfate 325 (65 FE) MG tablet Take 325 mg by mouth daily with breakfast.    . prenatal vitamin w/FE, FA (NATACHEW) 29-1 MG CHEW chewable tablet Chew 1 tablet by mouth daily at 12 noon. 30 tablet 3   No current facility-administered medications on file prior to visit.     No Known Allergies   Objective:  BP 110/82   Pulse (!) 110   Temp 99.1 F (37.3 C) (Oral)   Resp 16   Ht 5' 8.11" (1.73 m)   Wt 146 lb (66.2 kg)   LMP 02/14/2017   SpO2 98%   BMI 22.13 kg/m   Physical Exam  Constitutional: She is oriented to person, place, and time and  well-developed, well-nourished, and in no distress.  HENT:  Head: Normocephalic and atraumatic.  Mouth/Throat: Uvula is midline, oropharynx is clear and moist and mucous membranes are normal.  Eyes: Conjunctivae are normal.  Neck: Normal range of motion.  Cardiovascular: Normal rate, regular rhythm and normal heart sounds.  Pulmonary/Chest: Effort normal.  Abdominal: Soft. Normal appearance and bowel sounds are normal. There is no tenderness. There is no CVA tenderness.  Neurological: She is alert and oriented to person, place, and time. Gait normal.  Skin: Skin is warm and dry.  Psychiatric: Affect normal.  Vitals reviewed.    Pulse Readings from Last 3 Encounters:  05/04/17 (!) 110  04/07/17 82  03/19/17 88   Results for orders placed or performed in visit on 05/04/17 (from the past 24 hour(s))  POCT urinalysis dipstick     Status: Abnormal   Collection Time: 05/04/17 11:48 AM  Result Value Ref Range   Color, UA yellow yellow   Clarity, UA cloudy (A) clear   Glucose, UA negative negative mg/dL   Bilirubin, UA negative negative   Ketones, POC UA moderate (40) (A) negative mg/dL   Spec Grav, UA [id number] [id number] - 1.025   Blood, UA negative negative   pH, UA 7.0 5.0 - 8.0   Protein Ur, POC negative negative mg/dL   Urobilinogen, UA 0.2 0.2 or 1.0 E.U./dL   Nitrite, UA Negative Negative   Leukocytes, UA Small (1+) (A) Negative  Assessment and Plan :  1. Non-intractable vomiting with nausea, unspecified vomiting type Pt is well appearing, in no distress. She can tolerate oral liquids and foods. Given Rx for doxylamine and vitamin b6. Encouraged to return to clinic if symptoms worsen, do not improve, or as needed - doxylamine, Sleep, (UNISOM) 25 MG tablet; Take 0.5-1 tablets (12.5-25 mg total) by mouth 2 (two) times daily as needed.  Dispense: 30 tablet; Refill: 0 - vitamin B-6 (PYRIDOXINE) 25 MG tablet; Take 1 tablet (25 mg total) by mouth every 8 (eight) hours.  Dispense: 30  tablet; Refill: 0  2. Urinary frequency - POCT urinalysis dipstick - Urine Culture  3. Leukocytes in urine UA dipstick similar to the prior UA dipstick on 04/07/17. She has consistently had urinary frequency and mild dysuria since pregnancy and has been on multiple rounds of abx. Last urine culture with no growth. Not convinced this is a UTI. Will send urine culture at this time. Will treat if bacteria growth noted on culture. Plan to follow up with OB/gyn in 3 days as planned and encouraged to discussed continued symptoms with them.   Benjiman Core PA-C  Primary Care at [facility] 05/04/2017 11:58 AM

## 2017-05-05 LAB — URINE CULTURE

## 2017-05-07 DIAGNOSIS — R112 Nausea with vomiting, unspecified: Secondary | ICD-10-CM | POA: Diagnosis not present

## 2017-05-23 DIAGNOSIS — O3680X0 Pregnancy with inconclusive fetal viability, not applicable or unspecified: Secondary | ICD-10-CM | POA: Diagnosis not present

## 2017-05-24 DIAGNOSIS — Z3402 Encounter for supervision of normal first pregnancy, second trimester: Secondary | ICD-10-CM | POA: Diagnosis not present

## 2017-06-08 DIAGNOSIS — O0992 Supervision of high risk pregnancy, unspecified, second trimester: Secondary | ICD-10-CM | POA: Diagnosis not present

## 2017-07-20 DIAGNOSIS — Z0000 Encounter for general adult medical examination without abnormal findings: Secondary | ICD-10-CM | POA: Diagnosis not present

## 2017-07-20 DIAGNOSIS — Z6824 Body mass index (BMI) 24.0-24.9, adult: Secondary | ICD-10-CM | POA: Diagnosis not present

## 2017-08-31 DIAGNOSIS — Z23 Encounter for immunization: Secondary | ICD-10-CM | POA: Diagnosis not present

## 2017-08-31 DIAGNOSIS — Z3402 Encounter for supervision of normal first pregnancy, second trimester: Secondary | ICD-10-CM | POA: Diagnosis not present

## 2017-08-31 DIAGNOSIS — Z3403 Encounter for supervision of normal first pregnancy, third trimester: Secondary | ICD-10-CM | POA: Diagnosis not present

## 2017-09-14 DIAGNOSIS — N898 Other specified noninflammatory disorders of vagina: Secondary | ICD-10-CM | POA: Diagnosis not present

## 2017-10-16 DIAGNOSIS — O26843 Uterine size-date discrepancy, third trimester: Secondary | ICD-10-CM | POA: Diagnosis not present

## 2017-10-29 DIAGNOSIS — O26843 Uterine size-date discrepancy, third trimester: Secondary | ICD-10-CM | POA: Diagnosis not present

## 2017-10-29 DIAGNOSIS — Z3403 Encounter for supervision of normal first pregnancy, third trimester: Secondary | ICD-10-CM | POA: Diagnosis not present

## 2017-10-29 DIAGNOSIS — Z3685 Encounter for antenatal screening for Streptococcus B: Secondary | ICD-10-CM | POA: Diagnosis not present

## 2017-11-09 DIAGNOSIS — Z3A38 38 weeks gestation of pregnancy: Secondary | ICD-10-CM | POA: Diagnosis not present

## 2017-11-09 DIAGNOSIS — O468X3 Other antepartum hemorrhage, third trimester: Secondary | ICD-10-CM | POA: Diagnosis not present

## 2017-11-09 DIAGNOSIS — O4292 Full-term premature rupture of membranes, unspecified as to length of time between rupture and onset of labor: Secondary | ICD-10-CM | POA: Diagnosis not present

## 2017-11-10 DIAGNOSIS — Z3A38 38 weeks gestation of pregnancy: Secondary | ICD-10-CM | POA: Diagnosis not present

## 2017-11-11 DIAGNOSIS — Z3A38 38 weeks gestation of pregnancy: Secondary | ICD-10-CM | POA: Diagnosis not present

## 2017-11-29 DIAGNOSIS — L720 Epidermal cyst: Secondary | ICD-10-CM | POA: Diagnosis not present

## 2018-01-01 DIAGNOSIS — L720 Epidermal cyst: Secondary | ICD-10-CM | POA: Diagnosis not present

## 2018-02-07 IMAGING — CT CT ANGIO CHEST
2 of 7 series · 19 of 46 positions shown · IV contrast (isovue)
Comparison: Radiographs 12/24/2016

CLINICAL DATA: Chest pain and dyspnea for 2 weeks

EXAM:
CT ANGIOGRAPHY CHEST WITH CONTRAST
TECHNIQUE: Multidetector CT imaging of the chest was performed using the
standard protocol during bolus administration of intravenous
contrast. Multiplanar CT image reconstructions and MIPs were
obtained to evaluate the vascular anatomy.
CONTRAST:  58 mL Isovue 370 intravenous

[Series 10: thins · axial · 0.64mm/px · z∈[+1236,+1494]mm · 16 of 416 slices shown]
[im 24/416  lung]
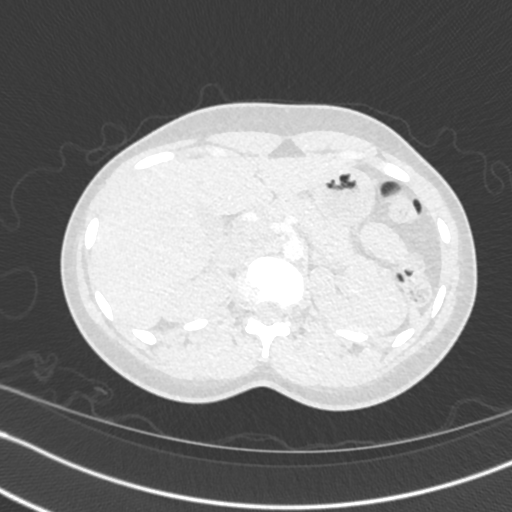
[im 47/416  soft-tissue]
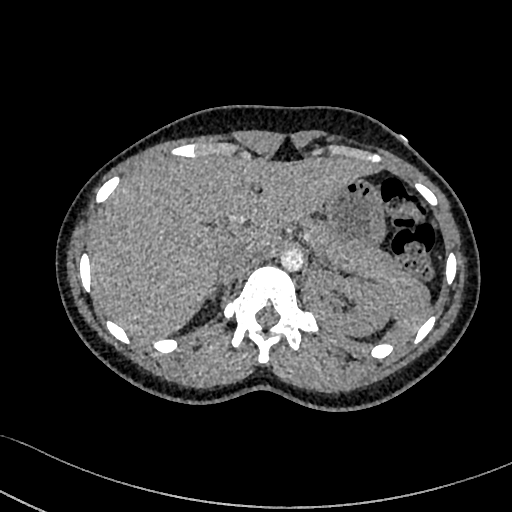
[im 70/416  lung]
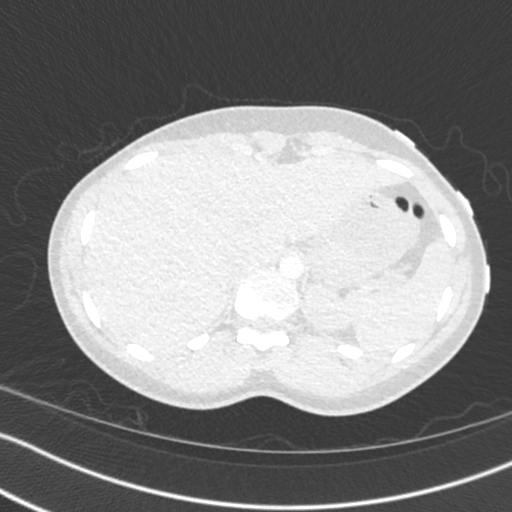
[im 93/416  soft-tissue]
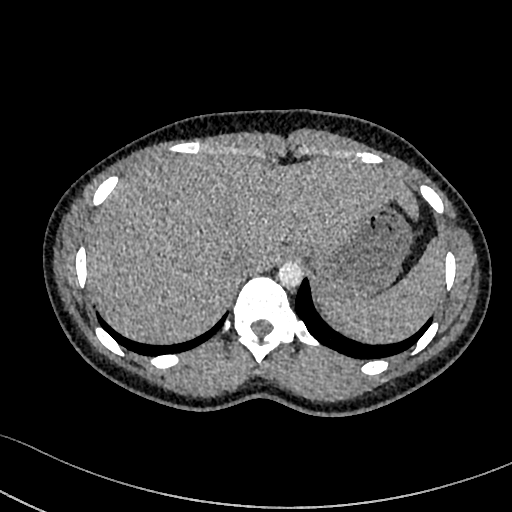
[im 116/416  lung]
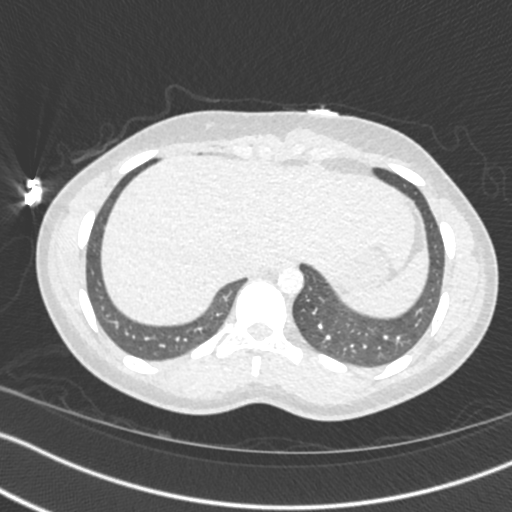
[im 139/416  soft-tissue]
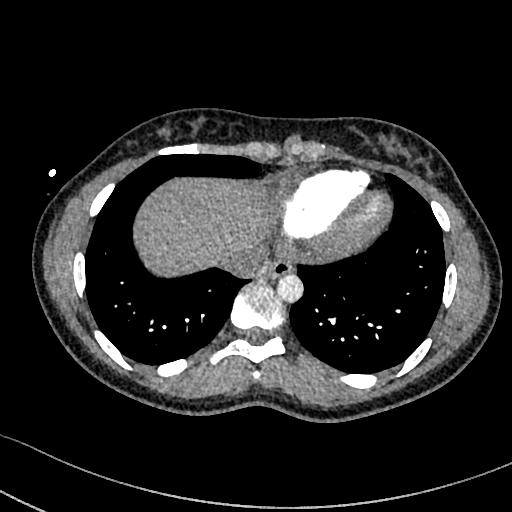
[im 162/416  lung]
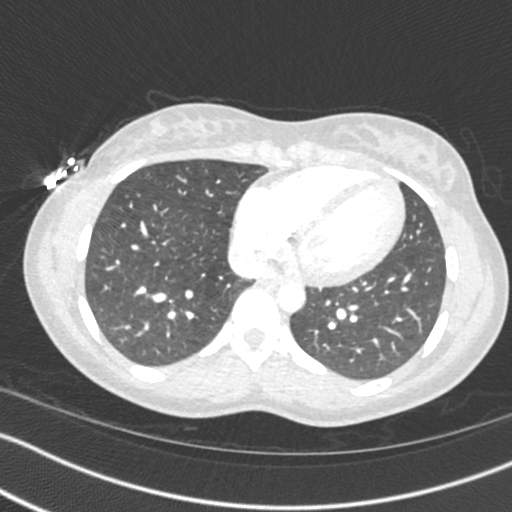
[im 185/416  soft-tissue]
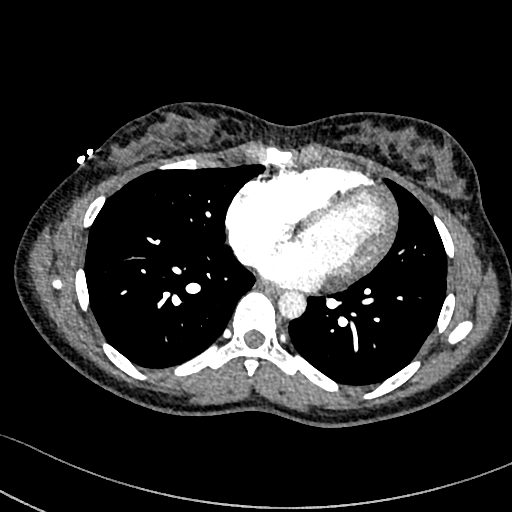
[im 231/416  lung]
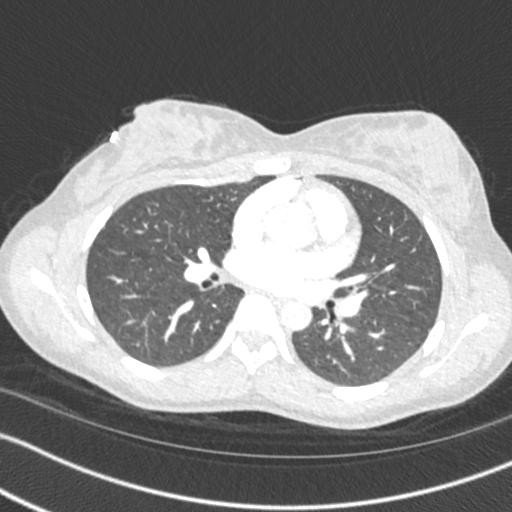
[im 254/416  soft-tissue]
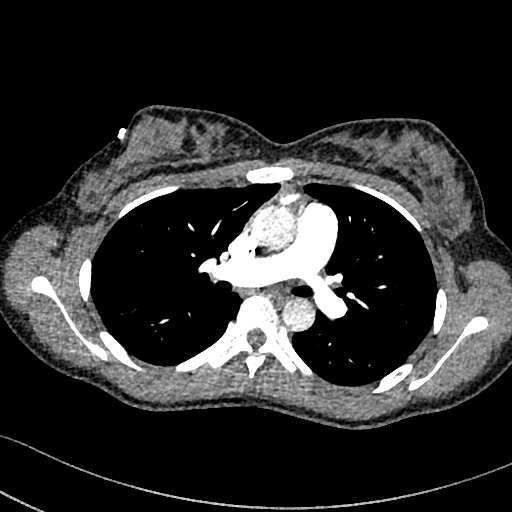
[im 277/416  lung]
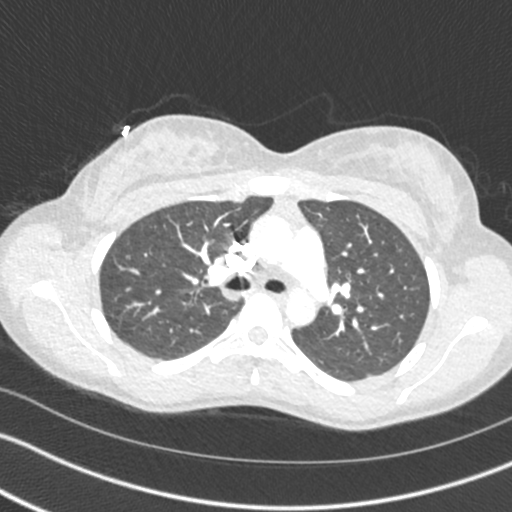
[im 300/416  soft-tissue]
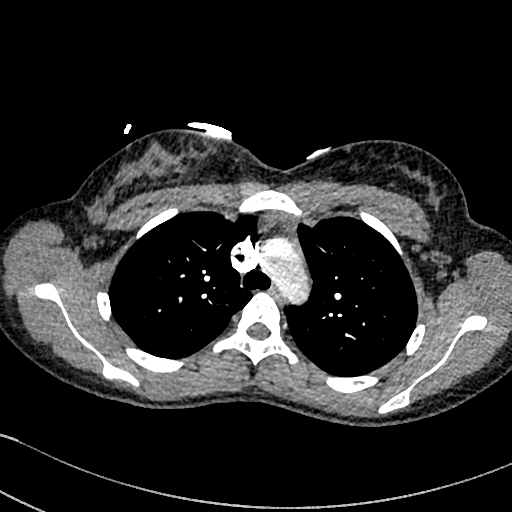
[im 323/416  lung]
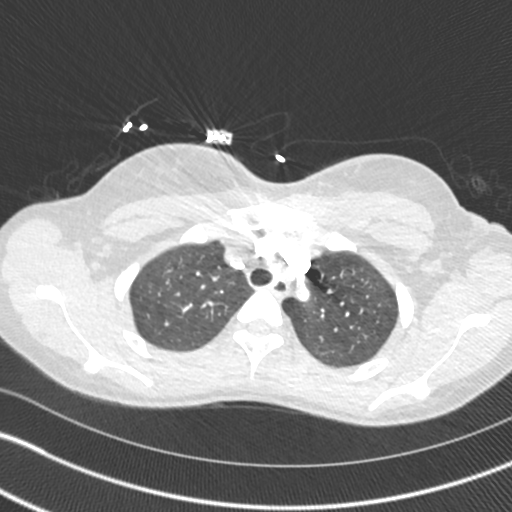
[im 346/416  soft-tissue]
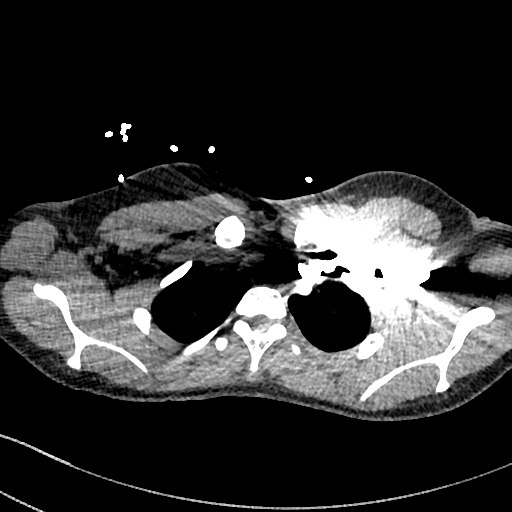
[im 369/416  lung]
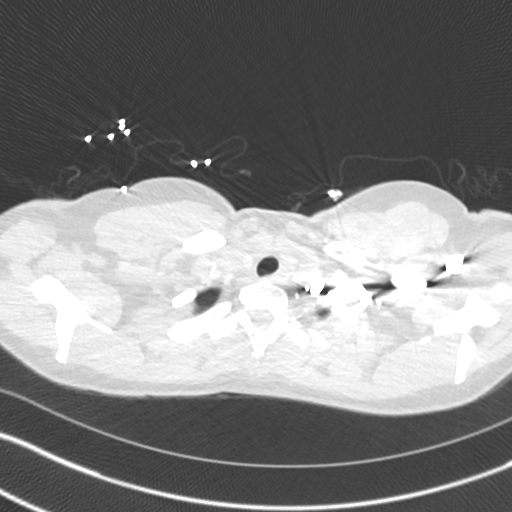
[im 392/416  soft-tissue]
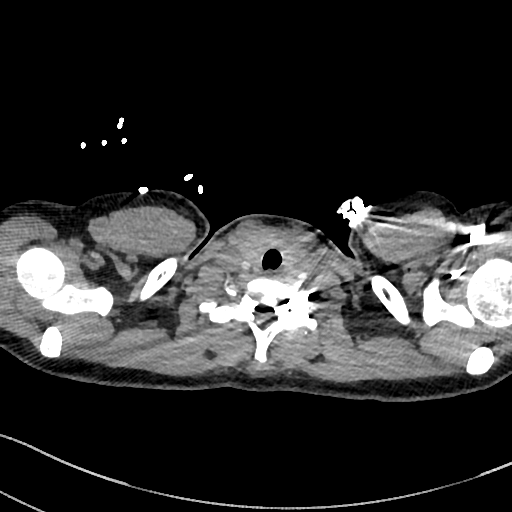

[Series 11: cor · coronal · 0.59mm/px · 3 of 147 slices shown]
[im 30/147  soft-tissue]
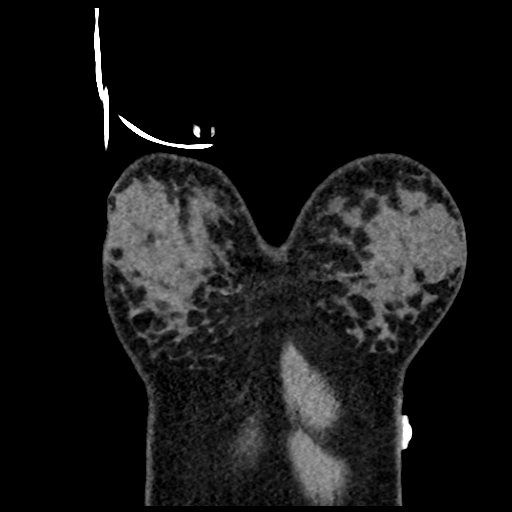
[im 59/147  soft-tissue]
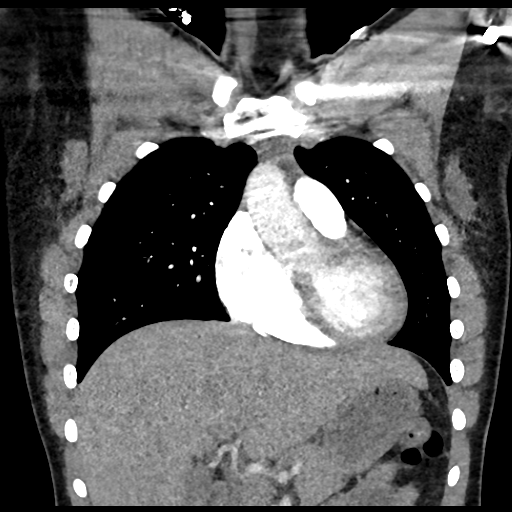
[im 88/147  soft-tissue]
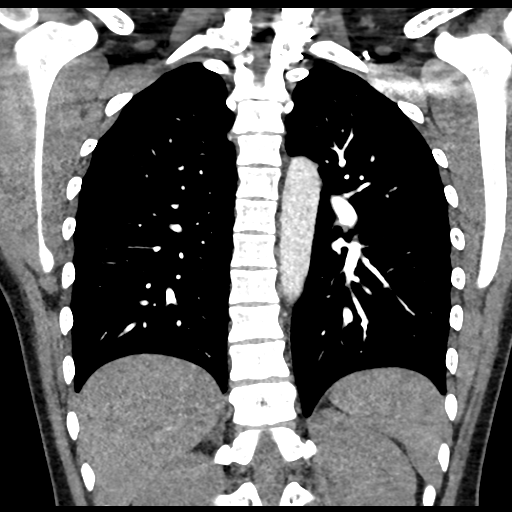

[19 of 46 positions shown; findings below may reference images not displayed]

FINDINGS: Cardiovascular: Satisfactory opacification of the pulmonary arteries
to the segmental level. No evidence of pulmonary embolism. Normal
heart size. No pericardial effusion.

Mediastinum/Nodes: No enlarged mediastinal, hilar, or axillary lymph
nodes. Thyroid gland, trachea, and esophagus demonstrate no
significant findings.

Lungs/Pleura: Lungs are clear. No pleural effusion or pneumothorax.

Upper Abdomen: No acute abnormality. Subcentimeter hypodensity in
the right hepatic lobe dome, probably benign although not
conclusively characterized.

Musculoskeletal: No chest wall abnormality. No acute or significant
osseous findings.

Review of the MIP images confirms the above findings.
IMPRESSION: No significant abnormality.  Negative for acute pulmonary embolism.

## 2018-04-26 DIAGNOSIS — L720 Epidermal cyst: Secondary | ICD-10-CM | POA: Diagnosis not present

## 2018-10-10 DIAGNOSIS — Z1159 Encounter for screening for other viral diseases: Secondary | ICD-10-CM | POA: Diagnosis not present

## 2018-10-13 DIAGNOSIS — N760 Acute vaginitis: Secondary | ICD-10-CM | POA: Diagnosis not present

## 2018-11-14 DIAGNOSIS — Z0000 Encounter for general adult medical examination without abnormal findings: Secondary | ICD-10-CM | POA: Diagnosis not present

## 2018-11-14 DIAGNOSIS — Z6825 Body mass index (BMI) 25.0-25.9, adult: Secondary | ICD-10-CM | POA: Diagnosis not present

## 2018-11-18 DIAGNOSIS — Z111 Encounter for screening for respiratory tuberculosis: Secondary | ICD-10-CM | POA: Diagnosis not present

## 2018-11-27 DIAGNOSIS — Z111 Encounter for screening for respiratory tuberculosis: Secondary | ICD-10-CM | POA: Diagnosis not present

## 2018-12-03 DIAGNOSIS — M779 Enthesopathy, unspecified: Secondary | ICD-10-CM | POA: Diagnosis not present

## 2019-04-21 DIAGNOSIS — N361 Urethral diverticulum: Secondary | ICD-10-CM | POA: Diagnosis not present
